# Patient Record
Sex: Female | Born: 1995 | Race: White | Hispanic: No | Marital: Single | Smoking: Former smoker
Health system: Southern US, Community
[De-identification: ages and names within clinical notes are randomized; demographics above are authoritative.]

## PROBLEM LIST (undated history)

## (undated) ENCOUNTER — Inpatient Hospital Stay: Payer: Self-pay

## (undated) DIAGNOSIS — R569 Unspecified convulsions: Secondary | ICD-10-CM

## (undated) DIAGNOSIS — J45909 Unspecified asthma, uncomplicated: Secondary | ICD-10-CM

## (undated) DIAGNOSIS — G4700 Insomnia, unspecified: Secondary | ICD-10-CM

## (undated) DIAGNOSIS — N289 Disorder of kidney and ureter, unspecified: Secondary | ICD-10-CM

## (undated) DIAGNOSIS — G43909 Migraine, unspecified, not intractable, without status migrainosus: Secondary | ICD-10-CM

## (undated) DIAGNOSIS — F419 Anxiety disorder, unspecified: Secondary | ICD-10-CM

## (undated) DIAGNOSIS — A6920 Lyme disease, unspecified: Secondary | ICD-10-CM

## (undated) DIAGNOSIS — F32A Depression, unspecified: Secondary | ICD-10-CM

## (undated) DIAGNOSIS — K589 Irritable bowel syndrome without diarrhea: Secondary | ICD-10-CM

## (undated) HISTORY — DX: Lyme disease, unspecified: A69.20

## (undated) HISTORY — DX: Anxiety disorder, unspecified: F41.9

## (undated) HISTORY — DX: Insomnia, unspecified: G47.00

## (undated) HISTORY — DX: Irritable bowel syndrome, unspecified: K58.9

## (undated) HISTORY — DX: Depression, unspecified: F32.A

## (undated) HISTORY — PX: NISSEN FUNDOPLICATION: SHX2091

---
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
## 2016-06-29 ENCOUNTER — Emergency Department
Admission: EM | Admit: 2016-06-29 | Discharge: 2016-06-30 | Disposition: A | Discharge status: Home / Self Care | Payer: Managed Care, Other (non HMO) | Attending: Emergency Medicine | Admitting: Emergency Medicine

## 2016-06-29 ENCOUNTER — Encounter: Payer: Self-pay | Admitting: Emergency Medicine

## 2016-06-29 DIAGNOSIS — R1031 Right lower quadrant pain: Secondary | ICD-10-CM | POA: Diagnosis not present

## 2016-06-29 DIAGNOSIS — Y999 Unspecified external cause status: Secondary | ICD-10-CM | POA: Diagnosis not present

## 2016-06-29 DIAGNOSIS — R109 Unspecified abdominal pain: Secondary | ICD-10-CM | POA: Diagnosis present

## 2016-06-29 DIAGNOSIS — R1012 Left upper quadrant pain: Secondary | ICD-10-CM | POA: Insufficient documentation

## 2016-06-29 DIAGNOSIS — Z79899 Other long term (current) drug therapy: Secondary | ICD-10-CM | POA: Diagnosis not present

## 2016-06-29 DIAGNOSIS — Y939 Activity, unspecified: Secondary | ICD-10-CM | POA: Diagnosis not present

## 2016-06-29 DIAGNOSIS — J45909 Unspecified asthma, uncomplicated: Secondary | ICD-10-CM | POA: Insufficient documentation

## 2016-06-29 DIAGNOSIS — M7918 Myalgia, other site: Secondary | ICD-10-CM

## 2016-06-29 DIAGNOSIS — R51 Headache: Secondary | ICD-10-CM | POA: Diagnosis not present

## 2016-06-29 DIAGNOSIS — R102 Pelvic and perineal pain: Secondary | ICD-10-CM | POA: Diagnosis not present

## 2016-06-29 DIAGNOSIS — Y92410 Unspecified street and highway as the place of occurrence of the external cause: Secondary | ICD-10-CM | POA: Insufficient documentation

## 2016-06-29 DIAGNOSIS — R1011 Right upper quadrant pain: Secondary | ICD-10-CM | POA: Diagnosis not present

## 2016-06-29 DIAGNOSIS — M549 Dorsalgia, unspecified: Secondary | ICD-10-CM | POA: Diagnosis not present

## 2016-06-29 HISTORY — DX: Unspecified asthma, uncomplicated: J45.909
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
## 2016-06-30 ENCOUNTER — Emergency Department: Payer: Managed Care, Other (non HMO)

## 2016-06-30 DIAGNOSIS — R1031 Right lower quadrant pain: Secondary | ICD-10-CM | POA: Diagnosis not present

## 2016-06-30 LAB — HCG, QUANTITATIVE, PREGNANCY: hCG, Beta Chain, Quant, S: 1 m[IU]/mL (ref <5)

## 2016-06-30 LAB — POCT PREGNANCY, URINE: Preg Test, Ur: NEGATIVE

## 2016-06-30 MED ORDER — LORAZEPAM 2 MG/ML IJ SOLN
INTRAMUSCULAR | Status: AC
  Filled 2016-06-30: qty 1

## 2016-06-30 MED ORDER — IOPAMIDOL (ISOVUE-300) INJECTION 61%
100.0000 mL | Freq: Once | INTRAVENOUS | Status: AC | PRN
  Administered 2016-06-30: 100 mL via INTRAVENOUS

## 2016-06-30 MED ORDER — CYCLOBENZAPRINE HCL 10 MG PO TABS: 10.0000 mg | ORAL_TABLET | Freq: Three times a day (TID) | ORAL | 0 refills | Status: DC | PRN

## 2016-06-30 MED ORDER — LORAZEPAM 2 MG/ML IJ SOLN: 1.0000 mg | Freq: Once | INTRAMUSCULAR | Status: DC

## 2016-06-30 MED ORDER — ONDANSETRON HCL 4 MG/2ML IJ SOLN
4.0000 mg | Freq: Once | INTRAMUSCULAR | Status: AC
  Administered 2016-06-30: 4 mg via INTRAVENOUS
  Filled 2016-06-30: qty 2

## 2016-06-30 MED ORDER — MORPHINE SULFATE (PF) 2 MG/ML IV SOLN
2.0000 mg | Freq: Once | INTRAVENOUS | Status: AC
  Administered 2016-06-30: 2 mg via INTRAVENOUS
  Filled 2016-06-30: qty 1

## 2016-06-30 NOTE — ED Notes (Signed)
Discharge instructions reviewed with patient. Patient verbalized understanding. Patient taken to lobby via wheelchair and helped into vehicle without difficulty.  

## 2016-06-30 NOTE — ED Notes (Signed)
Pt attempted to pee for POCT preg test but was unable to. Pt provided with water.
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
## 2018-11-27 ENCOUNTER — Encounter: Payer: Self-pay | Admitting: Physician Assistant

## 2018-11-27 ENCOUNTER — Telehealth: Payer: Managed Care, Other (non HMO) | Admitting: Physician Assistant

## 2018-11-27 DIAGNOSIS — R059 Cough, unspecified: Secondary | ICD-10-CM

## 2018-11-27 DIAGNOSIS — R0602 Shortness of breath: Secondary | ICD-10-CM

## 2018-11-27 DIAGNOSIS — J111 Influenza due to unidentified influenza virus with other respiratory manifestations: Secondary | ICD-10-CM

## 2018-11-27 DIAGNOSIS — R05 Cough: Secondary | ICD-10-CM

## 2018-11-27 MED ORDER — BENZONATATE 100 MG PO CAPS: 100.0000 mg | ORAL_CAPSULE | Freq: Three times a day (TID) | ORAL | 0 refills | Status: DC | PRN

## 2018-11-27 NOTE — Progress Notes (Signed)
E-Visit for Corona Virus Screening  Based on your current symptoms, it seems unlikely that your symptoms are related to the Coronavirus.     As per our converstaion, you have stated that yesterday you have tested positive for Flu A. Continue with the albuerol inhlaer, taken Tylenol as needed for the fever, and any worsening go to the ER. I have sent in medication for your cough. If the shortness of breath continues or worsens, go to the ER  Coronavirus disease 2019 (COVID-19) is a respiratory illness that can spread from person to person. The virus that causes COVID-19 is a new virus that was first identified in the country of [place] but is now found in multiple other countries and has spread to the [place].  Symptoms associated with the virus are mild to severe fever, cough, and shortness of breath. There is currently no vaccine to protect against COVID-19, and there is no specific antiviral treatment for the virus.   To be considered HIGH RISK for Coronavirus (COVID-19), you have to meet the following criteria:  . Traveled to [place], [place], [place], [place] or [place]; or in the [place] to [place], [place], [place], or [place]; and have fever, cough, and shortness of breath within the last 2 weeks of travel OR  . Been in close contact with a person diagnosed with COVID-19 within the last 2 weeks and have fever, cough, and shortness of breath  . IF YOU DO NOT MEET THESE CRITERIA, YOU ARE CONSIDERED LOW RISK FOR COVID-19.   It is vitally important that if you feel that you have an infection such as this virus or any other virus that you stay home and away from places where you may spread it to others.  You should self-quarantine for 14 days if you have symptoms that could potentially be coronavirus and avoid contact with people age 28 and older.   You can use medication such as A prescription cough medication called Tessalon Perles 100 mg. You may take 1-2 capsules every 8 hours  as needed for cough  You may also take acetaminophen (Tylenol) as needed for fever.   Reduce your risk of any infection by using the same precautions used for avoiding the common cold or flu:  Marland Kitchen Wash your hands often with soap and warm water for at least 20 seconds.  If soap and water are not readily available, use an alcohol-based hand sanitizer with at least 60% alcohol.  . If coughing or sneezing, cover your mouth and nose by coughing or sneezing into the elbow areas of your shirt or coat, into a tissue or into your sleeve (not your hands). . Avoid shaking hands with others and consider head nods or verbal greetings only. . Avoid touching your eyes, nose, or mouth with unwashed hands.  . Avoid close contact with people who are sick. . Avoid places or events with large numbers of people in one location, like concerts or sporting events. . Carefully consider travel plans you have or are making. . If you are planning any travel outside or inside the [place], visit the CDC's Travelers' Health webpage for the latest health notices. . If you have some symptoms but not all symptoms, continue to monitor at home and seek medical attention if your symptoms worsen. . If you are having a medical emergency, call 911.  HOME CARE . Only take medications as instructed by your medical team. . Drink plenty of fluids and get plenty of rest. .  A steam or ultrasonic humidifier can help if you have congestion.   GET HELP RIGHT AWAY IF: . You develop worsening fever. . You become short of breath . You cough up blood. . Your symptoms become more severe MAKE SURE YOU   Understand these instructions.  Will watch your condition.  Will get help right away if you are not doing well or get worse.  Your e-visit answers were reviewed by a board certified advanced clinical practitioner to complete your personal care plan.  Depending on the condition, your plan could have included both over the counter or  prescription medications.  If there is a problem please reply once you have received a response from your provider. Your safety is important to [place].  If you have drug allergies check your prescription carefully.    You can use MyChart to ask questions about today's visit, request a non-urgent call back, or ask for a work or school excuse for 24 hours related to this e-Visit. If it has been greater than 24 hours you will need to follow up with your provider, or enter a new e-Visit to address those concerns. You will get an e-mail in the next two days asking about your experience.  I hope that your e-visit has been valuable and will speed your recovery. Thank you for using e-visits.   I have spent 7 min in completion and review of this note- [facility]

## 2019-03-26 DIAGNOSIS — J45909 Unspecified asthma, uncomplicated: Secondary | ICD-10-CM | POA: Insufficient documentation

## 2020-01-02 ENCOUNTER — Ambulatory Visit (INDEPENDENT_AMBULATORY_CARE_PROVIDER_SITE_OTHER): Payer: BC Managed Care – PPO | Admitting: Family Medicine

## 2020-01-02 ENCOUNTER — Other Ambulatory Visit: Payer: Self-pay

## 2020-01-02 ENCOUNTER — Encounter: Payer: Self-pay | Admitting: Family Medicine

## 2020-01-02 VITALS — BP 132/60 | HR 106 | Ht 60.25 in | Wt 100.2 lb

## 2020-01-02 DIAGNOSIS — R21 Rash and other nonspecific skin eruption: Secondary | ICD-10-CM

## 2020-01-02 DIAGNOSIS — F32A Depression, unspecified: Secondary | ICD-10-CM

## 2020-01-02 DIAGNOSIS — R41840 Attention and concentration deficit: Secondary | ICD-10-CM

## 2020-01-02 DIAGNOSIS — F329 Major depressive disorder, single episode, unspecified: Secondary | ICD-10-CM | POA: Diagnosis not present

## 2020-01-02 DIAGNOSIS — Z1321 Encounter for screening for nutritional disorder: Secondary | ICD-10-CM

## 2020-01-02 DIAGNOSIS — W57XXXA Bitten or stung by nonvenomous insect and other nonvenomous arthropods, initial encounter: Secondary | ICD-10-CM

## 2020-01-02 DIAGNOSIS — A6920 Lyme disease, unspecified: Secondary | ICD-10-CM

## 2020-01-02 DIAGNOSIS — Z130 Encounter for screening for diseases of the blood and blood-forming organs and certain disorders involving the immune mechanism: Secondary | ICD-10-CM | POA: Diagnosis not present

## 2020-01-02 DIAGNOSIS — Z13220 Encounter for screening for lipoid disorders: Secondary | ICD-10-CM | POA: Diagnosis not present

## 2020-01-02 DIAGNOSIS — S30861A Insect bite (nonvenomous) of abdominal wall, initial encounter: Secondary | ICD-10-CM

## 2020-01-02 MED ORDER — BUPROPION HCL ER (XL) 150 MG PO TB24: 150.0000 mg | ORAL_TABLET | Freq: Every day | ORAL | 4 refills | Status: DC

## 2020-01-02 MED ORDER — DOXYCYCLINE HYCLATE 100 MG PO CAPS: 100.0000 mg | ORAL_CAPSULE | Freq: Two times a day (BID) | ORAL | 0 refills | Status: DC

## 2020-01-02 NOTE — Progress Notes (Signed)
Subjective:    Patient ID: Jenny Cain, female    DOB: 1995/10/25, 24 y.o.   MRN: [id number]  HPI Chief Complaint  Patient presents with  . Tick Removal   This is a 24 yo female who presents today for above cc. She is not an established patient but will be returning at a later date to establish care.  She lives with her father who is also a patient here.  She works at [employer]. Has a dog, two [place] pigs, 2 hamsters.  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  Went partway through her program, she will be able to work at [employer] office where she is currently doing her clinical hours.  Last CPE-regular GYN care Pap-unsure Tdap-2008 per [facility], declines getting this today Not sexually active.   Went hiking 1.5 weeks ago. A few hours after hiking, found a tick on her body, pulled it off and flushed it. She went to [facility] a couple of days ago due to increasing rash, No intervention. Was told to put Neosporin on it. Rash has continued to spread. No new headaches, no body aches, no fever.   ADHD- was on adderall previously, has having difficulty with focus, in Vet program. Adderall made her lose her appetite and she did not like the way she felt on it. Has also been having some depression recently.   Review of Systems Per HPI    Objective:   Physical Exam Vitals reviewed.  Constitutional:      General: She is not in acute distress.    Appearance: Normal appearance. She is normal weight. She is not ill-appearing, toxic-appearing or diaphoretic.  HENT:     Head: Normocephalic and atraumatic.  Eyes:     Conjunctiva/sclera: Conjunctivae normal.  Cardiovascular:     Rate and Rhythm: Tachycardia present.  Pulmonary:     Effort: Pulmonary effort is normal.  Skin:    General: Skin is warm and dry.     Findings: Rash (diffuse area erythema on abdomen below umbilicuss. Pinpoint papule. No warmth, drainage. ) present.  Neurological:     Mental  Status: She is alert and oriented to person, place, and time.  Psychiatric:        Mood and Affect: Mood normal.        Behavior: Behavior normal.        Thought Content: Thought content normal.        Judgment: Judgment normal.       BP 132/60 (BP Location: Left Arm, Patient Position: Sitting, Cuff Size: Normal)   Pulse (!) 106   Ht 5' 0.25" (1.53 m)   Wt 100 lb 3.2 oz (45.5 kg)   SpO2 98%   BMI 19.41 kg/m  Depression screen [facility] 2/9 01/02/2020  Decreased Interest 0  Down, Depressed, Hopeless 1  PHQ - 2 Score 1  Altered sleeping 3  Tired, decreased energy 3  Change in appetite 0  Feeling bad or failure about yourself  3  Trouble concentrating 3  Moving slowly or fidgety/restless 0  Suicidal thoughts 0  PHQ-9 Score 13  Difficult doing work/chores Very difficult       Assessment & Plan:  1. Tick bite of abdomen, initial encounter - with rash, will go ahead and check labs and give doxycycline. Discussed need for avoiding sun exposure, she will take after her beach trip, warned of possible problems if she became pregnant, to avoid pregnancy  while taking - doxycycline (VIBRAMYCIN) 100 MG capsule; Take 1 capsule (100 mg total) by mouth 2 (two) times daily.  Dispense: 14 capsule; Refill: 0 - Rocky mtn spotted fvr abs pnl(IgG+IgM) - B. burgdorfi antibodies by WB  2. Poor concentration - buPROPion (WELLBUTRIN XL) 150 MG 24 hr tablet; Take 1 tablet (150 mg total) by mouth daily.  Dispense: 30 tablet; Refill: 4  3. Rash - doxycycline (VIBRAMYCIN) 100 MG capsule; Take 1 capsule (100 mg total) by mouth 2 (two) times daily.  Dispense: 14 capsule; Refill: 0 - Rocky mtn spotted fvr abs pnl(IgG+IgM) - B. burgdorfi antibodies by WB  4. Screening for deficiency anemia - CBC with Differential  5. Screening for lipid disorders - Lipid Panel  6. Encounter for vitamin deficiency screening - Vitamin D, 25-hydroxy  7. Depression, unspecified depression type - CBC with Differential -  Vitamin D, 25-hydroxy - Bupropion xl 150 mg po qd - will discuss further at establish care visit  This visit occurred during the SARS-CoV-2 public health emergency.  Safety protocols were in place, including screening questions prior to the visit, additional usage of staff PPE, and extensive cleaning of exam room while observing appropriate contact time as indicated for disinfecting solutions.     Olean Ree, FNP-BC  [facility] at [facility], [facility]  01/02/2020 4:26 PM

## 2020-01-02 NOTE — Patient Instructions (Signed)
Good to meet you today  Start doxycycline after beach trip  I will notify you of lab results  Start bupropion 150 mg for concentration- start after you finish doxycycline    Living With Attention Deficit Hyperactivity Disorder If you have been diagnosed with attention deficit hyperactivity disorder (ADHD), you may be relieved that you now know why you have felt or behaved a certain way. Still, you may feel overwhelmed about the treatment ahead. You may also wonder how to get the support you need and how to deal with the condition day-to-day. With treatment and support, you can live with ADHD and manage your symptoms. How to manage lifestyle changes Managing stress Stress is your body's reaction to life changes and events, both good and bad. To cope with the stress of an ADHD diagnosis, it may help to:  Learn more about ADHD.  Exercise regularly. Even a short daily walk can lower stress levels.  Participate in training or education programs (including social skills training classes) that teach you to deal with symptoms.  Medicines Your health care provider may suggest certain medicines if he or she feels that they will help to improve your condition. Stimulant medicines are usually prescribed to treat ADHD, and therapy may also be prescribed. It is important to:  Avoid using alcohol and other substances that may prevent your medicines from working properly [facility]).  Talk with your pharmacist or health care provider about all the medicines that you take, their possible side effects, and what medicines are safe to take together.  Make it your goal to take part in all treatment decisions (shared decision-making). Ask about possible side effects of medicines that your health care provider recommends, and tell him or her how you feel about having those side effects. It is best if shared decision-making with your health care provider is part of your total treatment  plan. Relationships To strengthen your relationships with family members while treating your condition, consider taking part in family therapy. You might also attend self-help groups alone or with a loved one. Be honest about how your symptoms affect your relationships. Make an effort to communicate respectfully instead of fighting, and find ways to show others that you care. Psychotherapy may be useful in helping you cope with how ADHD affects your relationships. How to recognize changes in your condition The following signs may mean that your treatment is working well and your condition is improving:  Consistently being on time for appointments.  Being more organized at home and work.  Other people noticing improvements in your behavior.  Achieving goals that you set for yourself.  Thinking more clearly. The following signs may mean that your treatment is not working very well:  Feeling impatience or more confusion.  Missing, forgetting, or being late for appointments.  An increasing sense of disorganization and messiness.  More difficulty in reaching goals that you set for yourself.  Loved ones becoming angry or frustrated with you. Where to find support Talking to others  Keep emotion out of important discussions and speak in a calm, logical way.  Listen closely and patiently to your loved ones. Try to understand their point of view, and try to avoid getting defensive.  Take responsibility for the consequences of your actions.  Ask that others do not take your behaviors personally.  Aim to solve problems as they come up, and express your feelings instead of bottling them up.  Talk openly about what you need from your loved ones and how  they can support you.  Consider going to family therapy sessions or having your family meet with a specialist who deals with ADHD-related behavior problems. Finances Not all insurance plans cover mental health care, so it is important to  check with your insurance carrier. If paying for co-pays or counseling services is a problem, search for a local or county mental health care center. Public mental health care services may be offered there at a low cost or no cost when you are not able to see a private health care provider. If you are taking medicine for ADHD, you may be able to get the generic form, which may be less expensive than brand-name medicine. Some makers of prescription medicines also offer help to patients who cannot afford the medicines that they need. Follow these instructions at home:  Take over-the-counter and prescription medicines only as told by your health care provider. Check with your health care provider before taking any new medicines.  Create structure and an organized atmosphere at home. For example: ? Make a list of tasks, then rank them from most important to least important. Work on one task at a time until your listed tasks are done. ? Make a daily schedule and follow it consistently every day. ? Use an appointment calendar, and check it 2 or 3 times a day to keep on track. Keep it with you when you leave the house. ? Create spaces where you keep certain things, and always put things back in their places after you use them.  Keep all follow-up visits as told by your health care provider. This is important. Questions to ask your health care provider:  What are the risks and benefits of taking medicines?  Would I benefit from therapy?  How often should I follow up with a health care provider? Contact a health care provider if:  You have side effects from your medicines, such as: ? Repeated muscle twitches, coughing, or speech outbursts. ? Sleep problems. ? Loss of appetite. ? Depression. ? New or worsening behavior problems. ? Dizziness. ? Unusually fast heartbeat. ? Stomach pains. ? Headaches. Get help right away if:  You have a severe reaction to a medicine.  Your behavior suddenly  gets worse. Summary  With treatment and support, you can live with ADHD and manage your symptoms.  The medicines that are most often prescribed for ADHD are stimulants.  Consider taking part in family therapy or self-help groups with family members or friends.  When you talk with friends and family about your ADHD, be patient and communicate openly.  Take over-the-counter and prescription medicines only as told by your health care provider. Check with your health care provider before taking any new medicines. This information is not intended to replace advice given to you by your health care provider. Make sure you discuss any questions you have with your health care provider. Document Revised: 12/15/2018 Document Reviewed: 12/23/2016 Elsevier Patient Education  2020 [employer].

## 2020-01-03 LAB — LIPID PANEL
Cholesterol: 157 mg/dL (ref 0–200)
HDL: 61.4 mg/dL (ref >39.00)
LDL Cholesterol: 84 mg/dL (ref 0–99)
NonHDL: 95.1
Total CHOL/HDL Ratio: 3
Triglycerides: 55 mg/dL (ref 0.0–149.0)
VLDL: 11 mg/dL (ref 0.0–40.0)

## 2020-01-03 LAB — CBC WITH DIFFERENTIAL/PLATELET
Basophils Absolute: 0.1 10*3/uL (ref 0.0–0.1)
Basophils Relative: 0.9 % (ref 0.0–3.0)
Eosinophils Absolute: 0.2 10*3/uL (ref 0.0–0.7)
Eosinophils Relative: 2.2 % (ref 0.0–5.0)
HCT: 39.5 % (ref 36.0–46.0)
Hemoglobin: 13.6 g/dL (ref 12.0–15.0)
Lymphocytes Relative: 24.2 % (ref 12.0–46.0)
Lymphs Abs: 1.9 10*3/uL (ref 0.7–4.0)
MCHC: 34.4 g/dL (ref 30.0–36.0)
MCV: 96.1 fl (ref 78.0–100.0)
Monocytes Absolute: 0.6 10*3/uL (ref 0.1–1.0)
Monocytes Relative: 6.9 % (ref 3.0–12.0)
Neutro Abs: 5.3 10*3/uL (ref 1.4–7.7)
Neutrophils Relative %: 65.8 % (ref 43.0–77.0)
Platelets: 188 10*3/uL (ref 150.0–400.0)
RBC: 4.11 Mil/uL (ref 3.87–5.11)
RDW: 12.4 % (ref 11.5–15.5)
WBC: 8 10*3/uL (ref 4.0–10.5)

## 2020-01-03 LAB — VITAMIN D 25 HYDROXY (VIT D DEFICIENCY, FRACTURES): VITD: 27.33 ng/mL — ABNORMAL LOW (ref 30.00–100.00)

## 2020-01-04 LAB — B. BURGDORFI ANTIBODIES BY WB
B burgdorferi IgG Abs (IB): NEGATIVE
B burgdorferi IgM Abs (IB): POSITIVE — AB
Lyme Disease 18 kD IgG: NONREACTIVE
Lyme Disease 23 kD IgG: NONREACTIVE
Lyme Disease 23 kD IgM: REACTIVE — AB
Lyme Disease 28 kD IgG: NONREACTIVE
Lyme Disease 30 kD IgG: NONREACTIVE
Lyme Disease 39 kD IgG: NONREACTIVE
Lyme Disease 39 kD IgM: REACTIVE — AB
Lyme Disease 41 kD IgG: REACTIVE — AB
Lyme Disease 41 kD IgM: NONREACTIVE
Lyme Disease 45 kD IgG: NONREACTIVE
Lyme Disease 58 kD IgG: NONREACTIVE
Lyme Disease 66 kD IgG: NONREACTIVE
Lyme Disease 93 kD IgG: NONREACTIVE

## 2020-01-04 LAB — ROCKY MTN SPOTTED FVR ABS PNL(IGG+IGM)
RMSF IgG: NOT DETECTED
RMSF IgM: NOT DETECTED

## 2020-01-04 MED ORDER — DOXYCYCLINE HYCLATE 100 MG PO CAPS: 100.0000 mg | ORAL_CAPSULE | Freq: Two times a day (BID) | ORAL | 0 refills | Status: AC

## 2020-01-04 NOTE — Addendum Note (Signed)
Addended by: Olean Ree B on: 01/04/2020 04:58 PM   Modules accepted: Orders

## 2020-01-08 ENCOUNTER — Telehealth: Payer: Self-pay

## 2020-01-08 NOTE — Telephone Encounter (Signed)
Please call patient and let her know that I do not think the antibiotic should affect her other medications. Be sure to take it with food and a full glass of water.

## 2020-01-08 NOTE — Telephone Encounter (Signed)
Pt left v/m that she had visit with Harlin Heys FNP on 01/02/20 and pt was dx with lyme disease. Pt was taking anxiety med prior to dx of lyme disease and since dx with lyme disease took anxiety med and pt had panic attack and felt exhausted; pt wanted to know if the lyme disease was affecting how the anxiety med was reacting to pt and should pt not take anxiety med until lyme disease was cleared. Pt request cb.

## 2020-01-09 NOTE — Telephone Encounter (Signed)
I attempted to contact patient, but VM box was full.

## 2020-01-11 ENCOUNTER — Encounter: Payer: Self-pay | Admitting: Family Medicine

## 2020-01-11 NOTE — Telephone Encounter (Signed)
Patient returned call  Advised of message from Deboraha Sprang She stated she does not think the Antibiotic is causing any issues She is concerned that it is her Anxiety medication.  Patient stated she ended up getting really sick after taking and also had an anxiety attack. She would like to know if she needs to cut the medication in half or what you suggest   Please advise

## 2020-01-11 NOTE — Telephone Encounter (Signed)
Called patient no answer, LMTCB and also sent message via MyChart regarding questions and medication. Asked patient to give [place] a call back.

## 2020-01-11 NOTE — Telephone Encounter (Signed)
Called patient. No answer. No DPR on file to leave message. Will try again later.

## 2020-01-11 NOTE — Telephone Encounter (Signed)
Attempted to call patient again.  No answer.  Will send my chart message to try and clarify her symptoms.

## 2020-01-18 ENCOUNTER — Telehealth: Payer: Self-pay

## 2020-01-18 ENCOUNTER — Encounter: Payer: Self-pay | Admitting: Family Medicine

## 2020-01-18 NOTE — Telephone Encounter (Signed)
Patient contacted the office requesting an appt. Patient states she has been taking the doxycycline as prescribe for lyme's disease, but she states she is not feeling any better - and is actually feeling worse.  She states she has a severe headache, left ear pain, body aches, and nasal congestion. Patient states she even feels like she has heart palpitations at times.  Patient denies any fever, or shortness of breath. I advised that with her symptoms, we could not bring her into the office, and she would need to be seen in an UC, as they could assess her symptoms and treat her appropriately. Patient states she took off of work today, and does not have the money to go to an UC. She would like any other recommendations from Debbie as to what she can do.

## 2020-01-18 NOTE — Telephone Encounter (Signed)
Pt has called again twice and is requesting a call back as soon as possible.  Jenny Cain is letting her know that we are sending this to Eunice Blase again but that we have been in clinic all day and we will call her back before the end of today.

## 2020-01-18 NOTE — Telephone Encounter (Signed)
[facility] Night - Client Nonclinical Telephone Record AccessNurse Client [facility] Night - Client Client Site [facility] - Night Physician [facility] - PHYSICIAN, Crissie Figures- MD Contact Type Call Who Is Calling Patient / Member / Family / Caregiver Caller Name Ghislaine Harcum Caller Phone Number [phone] Patient Name Jenny Cain Patient DOB May 23, 1996 Call Type Message Only Information Provided Reason for Call Request to Schedule Office Appointment Initial Comment Caller states she needs to make an appt. Additional Comment Disp. Time Disposition Final User 01/18/2020 8:06:48 AM General Information Provided Yes Green, Amy Call Closed By: Rudi Rummage Transaction Date/Time: 01/18/2020 8:04:39 AM (ET)

## 2020-01-18 NOTE — Telephone Encounter (Signed)
Called and spoke with patient.  She reports that she has "been working too much," at [employer] where she works.  She has been having to do a lot of heavy lifting and has been tired and achy.  She reports that she started doxycycline May 2 for her Lyme diagnosis.  She tells me that she has 17 pills remaining but is not sure that she counted correctly.  She should be almost finished with her 2-week course by now.  She is having runny nose left ear pain, headache, pressure behind her eyes and ears.  She has been having some chills and sweats but no fever.  She reports that she has good water intake.  She has been taking Benadryl at bedtime, Zicam.  No improvement with Tylenol.  I told her that I did not think her symptoms were related to her Lyme diagnosis.  I suspect she is having some allergic rhinitis symptoms and encouraged her to take daily antihistamine, use over-the-counter Afrin type nasal spray or if she thinks she can tolerate it, a little pseudoephedrine.  She is not sure that she can tolerate due to history of palpitations and anxiety.  I agreed to give her a note for today and tomorrow out of work and then 1 week of light duty.  Encouraged her follow-up at the beginning of next week if she is still having bothersome symptoms.

## 2020-02-11 ENCOUNTER — Telehealth: Payer: Self-pay | Admitting: Family Medicine

## 2020-02-11 ENCOUNTER — Encounter: Payer: Self-pay | Admitting: Family Medicine

## 2020-02-11 ENCOUNTER — Ambulatory Visit (INDEPENDENT_AMBULATORY_CARE_PROVIDER_SITE_OTHER): Payer: BC Managed Care – PPO | Admitting: Family Medicine

## 2020-02-11 ENCOUNTER — Other Ambulatory Visit: Payer: Self-pay

## 2020-02-11 VITALS — BP 122/86 | HR 111 | Temp 97.9°F | Ht 60.0 in | Wt 102.0 lb

## 2020-02-11 DIAGNOSIS — G2581 Restless legs syndrome: Secondary | ICD-10-CM | POA: Diagnosis not present

## 2020-02-11 DIAGNOSIS — F909 Attention-deficit hyperactivity disorder, unspecified type: Secondary | ICD-10-CM | POA: Diagnosis not present

## 2020-02-11 DIAGNOSIS — F329 Major depressive disorder, single episode, unspecified: Secondary | ICD-10-CM | POA: Diagnosis not present

## 2020-02-11 DIAGNOSIS — F419 Anxiety disorder, unspecified: Secondary | ICD-10-CM | POA: Diagnosis not present

## 2020-02-11 DIAGNOSIS — F32A Depression, unspecified: Secondary | ICD-10-CM

## 2020-02-11 MED ORDER — GABAPENTIN 100 MG PO CAPS
100.0000 mg | ORAL_CAPSULE | Freq: Every day | ORAL | 1 refills | Status: DC
Start: 2020-02-11 — End: 2021-01-29

## 2020-02-11 NOTE — Telephone Encounter (Signed)
Pt seen today and did not make it to the lab to have labs drawn.   LM for patient to call back to schedule a LABS ONLY appt.

## 2020-02-11 NOTE — Patient Instructions (Addendum)
Can try over the counter magnesium for restless legs  I have sent in gabapentin for your restless legs. Take one tablet 1-2 hours before bedtime. Can increase to 2 tablets after one week if tolerating and can increase to 3 tablets if needed.   Follow up in 3 months    Restless Legs Syndrome Restless legs syndrome is a condition that causes uncomfortable feelings or sensations in the legs, especially while sitting or lying down. The sensations usually cause an overwhelming urge to move the legs. The arms can also sometimes be affected. The condition can range from mild to severe. The symptoms often interfere with a person's ability to sleep. What are the causes? The cause of this condition is not known. What increases the risk? The following factors may make you more likely to develop this condition:  Being older than 50.  Pregnancy.  Being a woman. In general, the condition is more common in women than in men.  A family history of the condition.  Having iron deficiency.  Overuse of caffeine, nicotine, or alcohol.  Certain medical conditions, such as kidney disease, Parkinson's disease, or nerve damage.  Certain medicines, such as those for high blood pressure, nausea, colds, allergies, depression, and some heart conditions. What are the signs or symptoms? The main symptom of this condition is uncomfortable sensations in the legs, such as:  Pulling.  Tingling.  Prickling.  Throbbing.  Crawling.  Burning. Usually, the sensations:  Affect both sides of the body.  Are worse when you sit or lie down.  Are worse at night. These may wake you up or make it difficult to fall asleep.  Make you have a strong urge to move your legs.  Are temporarily relieved by moving your legs. The arms can also be affected, but this is rare. People who have this condition often have tiredness during the day because of their lack of sleep at night. How is this diagnosed? This condition  may be diagnosed based on:  Your symptoms.  Blood tests. In some cases, you may be monitored in a sleep lab by a specialist (a sleep study). This can detect any disruptions in your sleep. How is this treated? This condition is treated by managing the symptoms. This may include:  Lifestyle changes, such as exercising, using relaxation techniques, and avoiding caffeine, alcohol, or tobacco.  Medicines. Anti-seizure medicines may be tried first. Follow these instructions at home:     General instructions  Take over-the-counter and prescription medicines only as told by your health care provider.  Use methods to help relieve the uncomfortable sensations, such as: ? Massaging your legs. ? Walking or stretching. ? Taking a cold or hot bath.  Keep all follow-up visits as told by your health care provider. This is important. Lifestyle  Practice good sleep habits. For example, go to bed and get up at the same time every day. Most adults should get 7-9 hours of sleep each night.  Exercise regularly. Try to get at least 30 minutes of exercise most days of the week.  Practice ways of relaxing, such as yoga or meditation.  Avoid caffeine and alcohol.  Do not use any products that contain nicotine or tobacco, such as cigarettes and e-cigarettes. If you need help quitting, ask your health care provider. Contact a health care provider if:  Your symptoms get worse or they do not improve with treatment. Summary  Restless legs syndrome is a condition that causes uncomfortable feelings or sensations in the legs,  especially while sitting or lying down.  The symptoms often interfere with a person's ability to sleep.  This condition is treated by managing the symptoms. You may need to make lifestyle changes or take medicines. This information is not intended to replace advice given to you by your health care provider. Make sure you discuss any questions you have with your health care  provider. Document Revised: 09/12/2017 Document Reviewed: 09/12/2017 Elsevier Patient Education  2020 [employer].

## 2020-02-11 NOTE — Progress Notes (Signed)
Subjective:    Patient ID: Jenny Cain, female    DOB: 04-05-1996, 24 y.o.   MRN: [id number]  HPI Chief Complaint  Patient presents with  . New Patient (Initial Visit)    Estab care   This is a 24 yo female who presents today to establish care. REcieved records from pediatrician, Dr. Truddie Coco.   Last CPE- 03/26/2019 Pap-v? 03/26/19- had gyn visit Tdap- 01/25/2014  Was seen 01/02/2020 for acute visit for tick bite. Completed.  course of doxy.   Depression- was started on bupropion at last visit for depression/ problems concentrating. Did not tolerate. Took one dose and had palpitations, felt like she was going to pass out. Took adderall but didn't sleep or eat. Last testing in HS. Also with hx concussion. Longstanding history depression/ anxiety. Increased stress with new job, going to school.   Poor sleep. Trouble going and staying asleep. Sleeping 5 hours on good day. Takes 1-2 diphenhydramine.   Has had restless legs since 3rd grade. Overwhelming need to move and "pop," legs when trying to go to sleep.    Review of Systems Per HPI    Objective:   Physical Exam Vitals reviewed.  Constitutional:      General: She is not in acute distress.    Appearance: Normal appearance. She is normal weight. She is not ill-appearing, toxic-appearing or diaphoretic.  HENT:     Head: Normocephalic and atraumatic.  Eyes:     Conjunctiva/sclera: Conjunctivae normal.  Cardiovascular:     Rate and Rhythm: Tachycardia present.  Pulmonary:     Effort: Pulmonary effort is normal.  Neurological:     Mental Status: She is alert.  Psychiatric:        Mood and Affect: Mood is anxious (mildly).        Speech: Speech normal.        Behavior: Behavior normal.        Thought Content: Thought content normal.          BP 122/86 (BP Location: Left Arm, Patient Position: Sitting, Cuff Size: Normal)   Pulse (!) 111   Temp 97.9 F (36.6 C) (Temporal)   Ht 5' (1.524 m)   Wt 102 lb (46.3 kg)    LMP 02/09/2020 (Exact Date)   SpO2 96%   BMI 19.92 kg/m  Wt Readings from Last 3 Encounters:  02/11/20 102 lb (46.3 kg)  01/02/20 100 lb 3.2 oz (45.5 kg)  06/29/16 103 lb (46.7 kg)   Depression screen [facility] 2/9 02/11/2020 01/02/2020  Decreased Interest 0 0  Down, Depressed, Hopeless 0 1  PHQ - 2 Score 0 1  Altered sleeping 3 3  Tired, decreased energy 3 3  Change in appetite 0 0  Feeling bad or failure about yourself  0 3  Trouble concentrating 1 3  Moving slowly or fidgety/restless 0 0  Suicidal thoughts 0 0  PHQ-9 Score 7 13  Difficult doing work/chores Somewhat difficult Very difficult     Assessment & Plan:  1. Restless legs - worse with diphenhydramine, will try low dose gabapentin, discussed possible side effects, will check ferritin. Other labs checked recently.  - gabapentin (NEURONTIN) 100 MG capsule; Take 1-3 capsules (100-300 mg total) by mouth at bedtime.  Dispense: 30 capsule; Refill: 1 - Comprehensive metabolic panel; Future - Ferritin; Future - follow up in 3 months  2. Attention deficit hyperactivity disorder (ADHD), unspecified ADHD type - provided her resources for evaluation as associated anxiety/ depression and prior failed medication  make this more difficult  3. Anxiety and depression - per number 2, she is agreeable to seeing psychology for testing, recommendations  This visit occurred during the SARS-CoV-2 public health emergency.  Safety protocols were in place, including screening questions prior to the visit, additional usage of staff PPE, and extensive cleaning of exam room while observing appropriate contact time as indicated for disinfecting solutions.      Olean Ree, FNP-BC  [facility] at [facility], [facility]  02/11/2020 5:37 PM

## 2020-02-18 NOTE — Telephone Encounter (Signed)
Message left for patient to return my call.  

## 2020-02-20 ENCOUNTER — Encounter: Payer: Self-pay | Admitting: *Deleted

## 2020-02-20 NOTE — Telephone Encounter (Signed)
Left detailed message about scheduling a LABS ONLY appt.   Letter mailed to contact our office.   Nothing further needed. q

## 2020-03-05 NOTE — Telephone Encounter (Signed)
Pt was seen 02/11/20 in office.   Nothing further needed.

## 2020-03-07 ENCOUNTER — Ambulatory Visit: Payer: BC Managed Care – PPO | Admitting: Family Medicine

## 2020-03-07 DIAGNOSIS — Z0289 Encounter for other administrative examinations: Secondary | ICD-10-CM

## 2020-03-13 ENCOUNTER — Other Ambulatory Visit: Payer: Self-pay

## 2020-03-13 ENCOUNTER — Ambulatory Visit
Admission: EM | Admit: 2020-03-13 | Discharge: 2020-03-13 | Disposition: A | Discharge status: Home / Self Care | Payer: Self-pay | Attending: Internal Medicine | Admitting: Internal Medicine

## 2020-03-13 DIAGNOSIS — S0101XA Laceration without foreign body of scalp, initial encounter: Secondary | ICD-10-CM

## 2020-03-13 MED ORDER — SULFAMETHOXAZOLE-TRIMETHOPRIM 800-160 MG PO TABS: 1.0000 | ORAL_TABLET | Freq: Two times a day (BID) | ORAL | 0 refills | Status: DC

## 2020-03-13 NOTE — ED Provider Notes (Signed)
[facility]    CSN: [id number] Arrival date & time: 03/13/20  1752      History   Chief Complaint Chief Complaint  Patient presents with  . Work Related Injury  . Head Laceration    HPI Jenny Cain is a 24 y.o. female who presents today for evaluation of a forehead laceration.  The patient was at work at the [employer] clinic, she was leaning over to put up a leash and hit the corner of a metal cabinet which caused a laceration to her left forehead.  The laceration is a vertical laceration in nature and starts just above her eyebrow but does extend to her eyebrow.  She denies any vision loss or vision changes.  She does report a headache which is an aching throbbing discomfort but denies any numbness or tingling around her face or hands and denies any memory loss or loss of consciousness.  The patient did undergo a tetanus booster in 2015.  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  She denies any nausea or vomiting.  Denies any sensitivity to light.  She presents today for evaluation of her forehead laceration and possible laceration repair.  HPI  Past Medical History:  Diagnosis Date  . Anxiety   . Asthma   . Insomnia   . Migraine   . Premature baby   . Seizures [facility])     Patient Active Problem List   Diagnosis Date Noted  . Prematurity 02/11/2020  . Asthma 03/26/2019  . Headache(784.0) 02/12/2013  . Posttraumatic headache 02/12/2013  . Depression 02/12/2013  . Anxiety state, unspecified 02/12/2013  . Insomnia 02/12/2013  . Memory changes 02/12/2013    Past Surgical History:  Procedure Laterality Date  . NISSEN FUNDOPLICATION      OB History   No obstetric history on file.      Home Medications    Prior to Admission medications   Medication Sig Start Date End Date Taking? Authorizing Provider  albuterol (PROAIR HFA) 108 (90 Base) MCG/ACT inhaler ProAir HFA 90 mcg/actuation aerosol inhaler  INHALE 1 TO 2 PUFFS BY MOUTH EVERY 4 TO 6 HOURS AS  NEEDED   Yes [provider]  Ascorbic Acid (VITAMIN C) 1000 MG tablet Take 1,000 mg by mouth daily.   Yes [provider]  Cholecalciferol (VITAMIN D3) 25 MCG (1000 UT) CAPS Take 1 capsule by mouth daily.   Yes [provider]  gabapentin (NEURONTIN) 100 MG capsule Take 1-3 capsules (100-300 mg total) by mouth at bedtime. 02/11/20  Yes Emi Belfast, FNP  Melatonin 5 MG TABS Take by mouth.   Yes [provider]  sulfamethoxazole-trimethoprim (BACTRIM DS) 800-160 MG tablet Take 1 tablet by mouth 2 (two) times daily for 7 days. 03/13/20 03/20/20  Anson Oregon, PA-C    Family History Family History  Problem Relation Age of Onset  . Depression Mother   . Migraines Mother   . Depression Father   . Multiple sclerosis Father   . Heart attack Maternal Grandmother   . Heart attack Paternal Grandmother   . Breast cancer Maternal Aunt     Social History Social History   Tobacco Use  . Smoking status: Former Smoker    Years: 2.00    Types: Cigarettes  . Smokeless tobacco: Never Used  . Tobacco comment: smoked in high school  Vaping Use  . Vaping Use: Some days  . Devices: disposable  Substance Use Topics  . Alcohol use: Yes  Comment: once a month  . Drug use: No     Allergies   Amoxicillin, Cefprozil, and Penicillins   Review of Systems Review of Systems  Constitutional: Negative.   HENT: Negative.   Eyes: Negative.   Respiratory: Negative.   Cardiovascular: Negative.   Endocrine: Negative.   Genitourinary: Negative.   Musculoskeletal: Negative.   Skin: Positive for wound.  Allergic/Immunologic: Negative.   Neurological: Negative.   Hematological: Negative.   Psychiatric/Behavioral: Negative.   All other systems reviewed and are negative.  Physical Exam Triage Vital Signs ED Triage Vitals  Enc Vitals Group     BP 03/13/20 1814 (!) 143/99     Pulse Rate 03/13/20 1814 91     Resp 03/13/20 1814 18     Temp 03/13/20 1814  98.8 F (37.1 C)     Temp Source 03/13/20 1814 Oral     SpO2 03/13/20 1814 100 %     Weight 03/13/20 1812 102 lb (46.3 kg)     Height 03/13/20 1812 5' (1.524 m)     Head Circumference --      Peak Flow --      Pain Score 03/13/20 1811 9     Pain Loc --      Pain Edu? --      Excl. in GC? --    No data found.  Updated Vital Signs BP (!) 143/99 (BP Location: Left Arm)   Pulse 91   Temp 98.8 F (37.1 C) (Oral)   Resp 18   Ht 5' (1.524 m)   Wt 102 lb (46.3 kg)   LMP 03/09/2020   SpO2 100%   BMI 19.92 kg/m    Physical Exam Constitutional:      General: She is not in acute distress.    Appearance: She is not ill-appearing.  HENT:     Head:      Comments: 2 cm vertical laceration just superior to the left eyebrow Eyes:     General:        Left eye: No foreign body.     Extraocular Movements:     Left eye: Normal extraocular motion.     Conjunctiva/sclera:     Left eye: Left conjunctiva is not injected. No chemosis or exudate. Neurological:     Mental Status: She is alert.    Skin examination of the patient's forehead reveals a approximately 2 cm vertical laceration that extends just above the medial portion of her eyebrow and does extend down to the edge of her superior eyebrow.  The incision is not actively bleeding.  There is no foreign body which can be palpated or visualized.  The patient is able to raise her eyebrows and blink happily without significant discomfort.  She has full EOM without difficulty.  No vision changes to the left eye.  UC Treatments / Results  Labs (all labs ordered are listed, but only abnormal results are displayed) Labs Reviewed - No data to display  EKG   Radiology No results found.  Procedures Laceration Repair  Date/Time: 03/13/2020 7:53 PM Performed by: Anson Oregon, PA-C Authorized by: Merrilee Jansky, MD   Consent:    Consent obtained:  Verbal   Consent given by:  Patient   Risks discussed:  Infection, pain,  poor cosmetic result, poor wound healing and nerve damage   Alternatives discussed:  Observation Anesthesia (see MAR for exact dosages):    Anesthesia method:  Topical application and local infiltration   Local anesthetic:  Lidocaine 1% WITH epi Laceration details:    Location:  Face   Face location:  Forehead   Length (cm):  2   Depth (mm):  0.5 Repair type:    Repair type:  Simple Pre-procedure details:    Preparation:  Patient was prepped and draped in usual sterile fashion Exploration:    Hemostasis achieved with:  Epinephrine   Contaminated: no   Treatment:    Area cleansed with:  Saline   Amount of cleaning:  Standard   Irrigation solution:  Sterile saline   Irrigation method:  Syringe   Visualized foreign bodies/material removed: no   Skin repair:    Repair method:  Sutures   Suture size:  5-0   Suture material:  Nylon   Suture technique:  Simple interrupted   Number of sutures:  4 Approximation:    Approximation:  Close Post-procedure details:    Dressing:  Bulky dressing   Patient tolerance of procedure:  Tolerated well, no immediate complications   (including critical care time)  Medications Ordered in UC Medications - No data to display  Initial Impression / Assessment and Plan / UC Course  I have reviewed the triage vital signs and the nursing notes.  Pertinent labs & imaging results that were available during my care of the patient were reviewed by me and considered in my medical decision making (see chart for details).     1.  Treatment options were discussed today with the patient. 2.  The laceration was irrigated with sterile saline prior to primary closure using 4 interrupted sutures of 5-0 nylon. 3.  A bulky dressing was applied over the laceration site and Ace wrap applied.  She was instructed to keep the laceration dry until sutures are removed. 4.  Bactrim prescribed for prophylactic antibiotic coverage.  Tetanus is up to date. 5.  Patient with  headache from recent injury but no evidence for possible intra-cranial bleed.  Instructed the patient that if her headache worsens or fails to improve over the next few hours to proceed to the ER. 6.  Follow-up in 10 days for suture removal. Final Clinical Impressions(s) / UC Diagnoses   Final diagnoses:  Laceration of scalp without foreign body, initial encounter     Discharge Instructions     -Take antibiotics as directed. -Keep the laceration dry, keep it covered with a waterproof bandaid when showering. -Follow-up with [facility] in 10 days for suture removal.    ED Prescriptions    Medication Sig Dispense Auth. Provider   sulfamethoxazole-trimethoprim (BACTRIM DS) 800-160 MG tablet Take 1 tablet by mouth 2 (two) times daily for 7 days. 14 tablet Anson Oregon, [place]     PDMP not reviewed this encounter.   Anson Oregon, PA-C 03/13/20 2000

## 2020-03-13 NOTE — ED Triage Notes (Signed)
Patient states that around 530pm today she was leaning over to put up a leash and hit her head on a corner of a metal cabinet/sink. Patient has a laceration to her forehead extending to left eyebrow.

## 2020-03-13 NOTE — Discharge Instructions (Addendum)
-  Take antibiotics as directed. -Keep the laceration dry, keep it covered with a waterproof bandaid when showering. -Follow-up with [facility] in 10 days for suture removal.

## 2020-03-14 ENCOUNTER — Encounter: Payer: Self-pay | Admitting: Family Medicine

## 2020-03-14 ENCOUNTER — Ambulatory Visit (INDEPENDENT_AMBULATORY_CARE_PROVIDER_SITE_OTHER): Payer: BC Managed Care – PPO | Admitting: Family Medicine

## 2020-03-14 VITALS — BP 110/70 | HR 106 | Temp 98.0°F | Ht 60.0 in | Wt 100.8 lb

## 2020-03-14 DIAGNOSIS — M545 Low back pain: Secondary | ICD-10-CM

## 2020-03-14 DIAGNOSIS — G2581 Restless legs syndrome: Secondary | ICD-10-CM

## 2020-03-14 DIAGNOSIS — G8929 Other chronic pain: Secondary | ICD-10-CM

## 2020-03-14 LAB — COMPREHENSIVE METABOLIC PANEL
ALT: 11 U/L (ref 0–35)
AST: 17 U/L (ref 0–37)
Albumin: 4.8 g/dL (ref 3.5–5.2)
Alkaline Phosphatase: 55 U/L (ref 39–117)
BUN: 9 mg/dL (ref 6–23)
CO2: 26 mEq/L (ref 19–32)
Calcium: 9.4 mg/dL (ref 8.4–10.5)
Chloride: 103 mEq/L (ref 96–112)
Creatinine, Ser: 0.73 mg/dL (ref 0.40–1.20)
GFR: 97.79 mL/min (ref >60.00)
Glucose, Bld: 75 mg/dL (ref 70–99)
Potassium: 4 mEq/L (ref 3.5–5.1)
Sodium: 137 mEq/L (ref 135–145)
Total Bilirubin: 0.7 mg/dL (ref 0.2–1.2)
Total Protein: 7.1 g/dL (ref 6.0–8.3)

## 2020-03-14 LAB — FERRITIN: Ferritin: 68.3 ng/mL (ref 10.0–291.0)

## 2020-03-14 NOTE — Patient Instructions (Addendum)
No more than 800 mg of ibuprofen every 8 hours   Back Exercises These exercises help to make your trunk and back strong. They also help to keep the lower back flexible. Doing these exercises can help to prevent back pain or lessen existing pain.  If you have back pain, try to do these exercises 2-3 times each day or as told by your doctor.  As you get better, do the exercises once each day. Repeat the exercises more often as told by your doctor.  To stop back pain from coming back, do the exercises once each day, or as told by your doctor. Exercises Single knee to chest Do these steps 3-5 times in a row for each leg: 1. Lie on your back on a firm bed or the floor with your legs stretched out. 2. Bring one knee to your chest. 3. Grab your knee or thigh with both hands and hold them it in place. 4. Pull on your knee until you feel a gentle stretch in your lower back or buttocks. 5. Keep doing the stretch for 10-30 seconds. 6. Slowly let go of your leg and straighten it. Pelvic tilt Do these steps 5-10 times in a row: 1. Lie on your back on a firm bed or the floor with your legs stretched out. 2. Bend your knees so they point up to the ceiling. Your feet should be flat on the floor. 3. Tighten your lower belly (abdomen) muscles to press your lower back against the floor. This will make your tailbone point up to the ceiling instead of pointing down to your feet or the floor. 4. Stay in this position for 5-10 seconds while you gently tighten your muscles and breathe evenly. Cat-cow Do these steps until your lower back bends more easily: 1. Get on your hands and knees on a firm surface. Keep your hands under your shoulders, and keep your knees under your hips. You may put padding under your knees. 2. Let your head hang down toward your chest. Tighten (contract) the muscles in your belly. Point your tailbone toward the floor so your lower back becomes rounded like the back of a cat. 3. Stay in  this position for 5 seconds. 4. Slowly lift your head. Let the muscles of your belly relax. Point your tailbone up toward the ceiling so your back forms a sagging arch like the back of a cow. 5. Stay in this position for 5 seconds.  Press-ups Do these steps 5-10 times in a row: 1. Lie on your belly (face-down) on the floor. 2. Place your hands near your head, about shoulder-width apart. 3. While you keep your back relaxed and keep your hips on the floor, slowly straighten your arms to raise the top half of your body and lift your shoulders. Do not use your back muscles. You may change where you place your hands in order to make yourself more comfortable. 4. Stay in this position for 5 seconds. 5. Slowly return to lying flat on the floor.  Bridges Do these steps 10 times in a row: 1. Lie on your back on a firm surface. 2. Bend your knees so they point up to the ceiling. Your feet should be flat on the floor. Your arms should be flat at your sides, next to your body. 3. Tighten your butt muscles and lift your butt off the floor until your waist is almost as high as your knees. If you do not feel the muscles working in your  butt and the back of your thighs, slide your feet 1-2 inches farther away from your butt. 4. Stay in this position for 3-5 seconds. 5. Slowly lower your butt to the floor, and let your butt muscles relax. If this exercise is too easy, try doing it with your arms crossed over your chest. Belly crunches Do these steps 5-10 times in a row: 1. Lie on your back on a firm bed or the floor with your legs stretched out. 2. Bend your knees so they point up to the ceiling. Your feet should be flat on the floor. 3. Cross your arms over your chest. 4. Tip your chin a little bit toward your chest but do not bend your neck. 5. Tighten your belly muscles and slowly raise your chest just enough to lift your shoulder blades a tiny bit off of the floor. Avoid raising your body higher than  that, because it can put too much stress on your low back. 6. Slowly lower your chest and your head to the floor. Back lifts Do these steps 5-10 times in a row: 1. Lie on your belly (face-down) with your arms at your sides, and rest your forehead on the floor. 2. Tighten the muscles in your legs and your butt. 3. Slowly lift your chest off of the floor while you keep your hips on the floor. Keep the back of your head in line with the curve in your back. Look at the floor while you do this. 4. Stay in this position for 3-5 seconds. 5. Slowly lower your chest and your face to the floor. Contact a doctor if:  Your back pain gets a lot worse when you do an exercise.  Your back pain does not get better 2 hours after you exercise. If you have any of these problems, stop doing the exercises. Do not do them again unless your doctor says it is okay. Get help right away if:  You have sudden, very bad back pain. If this happens, stop doing the exercises. Do not do them again unless your doctor says it is okay. This information is not intended to replace advice given to you by your health care provider. Make sure you discuss any questions you have with your health care provider. Document Revised: 05/18/2018 Document Reviewed: 05/18/2018 Elsevier Patient Education  2020 [employer].

## 2020-03-14 NOTE — Progress Notes (Signed)
Subjective:    Patient ID: Jenny Cain, female    DOB: 28-Sep-1995, 24 y.o.   MRN: [id number]  HPI Chief Complaint  Patient presents with  . Muscle Pain    injured at work - thrown into a wall and fell - felt hip pop and pain in lower back.   This injury occurred last week. 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 Hip pain resolved. Low back with some continued pain, has had off and on pain for aw, no radiation, middle and right side, no incontinence. Took some Tylenol without relief. Some relief of back pain with ibuprofen.   Was at work yesterday and hit left forehead on cabinet with resulting laceration. Went to ER and had lac repair with 4 sutures. Some continued throbbing.   Review of Systems Per HPI    Objective:   Physical Exam Vitals reviewed.  Constitutional:      Appearance: Normal appearance. She is normal weight.  HENT:     Head:     Comments: Left forehead over eye with laceration, 4 intact sutures, no redness or drainage.  Eyes:     Extraocular Movements: Extraocular movements intact.     Conjunctiva/sclera: Conjunctivae normal.     Pupils: Pupils are equal, round, and reactive to light.  Cardiovascular:     Rate and Rhythm: Normal rate and regular rhythm.     Heart sounds: Normal heart sounds.     Comments: Heart rate normal on auscultation. Pulmonary:     Effort: Pulmonary effort is normal.     Breath sounds: Normal breath sounds.  Musculoskeletal:     Cervical back: Normal.     Thoracic back: Normal.     Lumbar back: No swelling, deformity or bony tenderness. Tenderness: bilateral paraspinals. Normal range of motion. Negative right straight leg raise test and negative left straight leg raise test.     Right lower leg: No edema.     Left lower leg: No edema.     Comments: Normal gait.  Upper extremity/lower extremity strength 5 out of 5.  Skin:    General: Skin is warm and dry.  Neurological:      Mental Status: She is alert and oriented to person, place, and time.     Motor: No weakness.     Gait: Gait normal.     Deep Tendon Reflexes: Reflexes normal.  Psychiatric:        Mood and Affect: Mood normal.        Behavior: Behavior normal.        Thought Content: Thought content normal.        Judgment: Judgment normal.       BP 110/70 (BP Location: Right Arm, Patient Position: Sitting, Cuff Size: Normal)   Pulse (!) 106   Temp 98 F (36.7 C) (Temporal)   Ht 5' (1.524 m)   Wt 100 lb 12.8 oz (45.7 kg)   LMP 03/09/2020   SpO2 97%   BMI 19.69 kg/m  Wt Readings from Last 3 Encounters:  03/14/20 100 lb 12.8 oz (45.7 kg)  03/13/20 102 lb (46.3 kg)  02/11/20 102 lb (46.3 kg)       Assessment & Plan:  1. Restless legs -She has been seen for this previously and left without having having labs done.  We will get labs today. - Comprehensive metabolic panel - Ferritin  2. Chronic bilateral low back pain without sciatica -No worrisome  history or findings on physical exam -Discussed over-the-counter NSAID use, provided verbal and written information regarding back exercises -If worsening or no improvement consider referral to PT  This visit occurred during the SARS-CoV-2 public health emergency.  Safety protocols were in place, including screening questions prior to the visit, additional usage of staff PPE, and extensive cleaning of exam room while observing appropriate contact time as indicated for disinfecting solutions.   Olean Ree, FNP-BC  [facility], [facility]  03/14/2020 12:50 PM

## 2020-04-25 ENCOUNTER — Encounter: Payer: Self-pay | Admitting: Family Medicine

## 2021-01-19 ENCOUNTER — Emergency Department (HOSPITAL_COMMUNITY)
Admission: EM | Admit: 2021-01-19 | Discharge: 2021-01-19 | Disposition: A | Discharge status: Home / Self Care | Payer: BC Managed Care – PPO | Attending: Emergency Medicine | Admitting: Emergency Medicine

## 2021-01-19 ENCOUNTER — Emergency Department (HOSPITAL_COMMUNITY): Payer: BC Managed Care – PPO

## 2021-01-19 ENCOUNTER — Other Ambulatory Visit: Payer: Self-pay

## 2021-01-19 ENCOUNTER — Encounter (HOSPITAL_COMMUNITY): Payer: Self-pay

## 2021-01-19 DIAGNOSIS — J45909 Unspecified asthma, uncomplicated: Secondary | ICD-10-CM | POA: Insufficient documentation

## 2021-01-19 DIAGNOSIS — N83201 Unspecified ovarian cyst, right side: Secondary | ICD-10-CM | POA: Diagnosis not present

## 2021-01-19 DIAGNOSIS — Z87891 Personal history of nicotine dependence: Secondary | ICD-10-CM | POA: Insufficient documentation

## 2021-01-19 DIAGNOSIS — R102 Pelvic and perineal pain: Secondary | ICD-10-CM

## 2021-01-19 DIAGNOSIS — D72829 Elevated white blood cell count, unspecified: Secondary | ICD-10-CM | POA: Insufficient documentation

## 2021-01-19 DIAGNOSIS — R1031 Right lower quadrant pain: Secondary | ICD-10-CM | POA: Diagnosis present

## 2021-01-19 LAB — CBC
HCT: 41.9 % (ref 36.0–46.0)
Hemoglobin: 14.7 g/dL (ref 12.0–15.0)
MCH: 33.3 pg (ref 26.0–34.0)
MCHC: 35.1 g/dL (ref 30.0–36.0)
MCV: 95 fL (ref 80.0–100.0)
Platelets: 198 10*3/uL (ref 150–400)
RBC: 4.41 MIL/uL (ref 3.87–5.11)
RDW: 11.5 % (ref 11.5–15.5)
WBC: 11.9 10*3/uL — ABNORMAL HIGH (ref 4.0–10.5)
nRBC: 0 % (ref 0.0–0.2)

## 2021-01-19 LAB — URINALYSIS, ROUTINE W REFLEX MICROSCOPIC
Bilirubin Urine: NEGATIVE
Glucose, UA: NEGATIVE mg/dL
Ketones, ur: NEGATIVE mg/dL
Leukocytes,Ua: NEGATIVE
Nitrite: NEGATIVE
Protein, ur: NEGATIVE mg/dL
Specific Gravity, Urine: 1.012 (ref 1.005–1.030)
pH: 5 (ref 5.0–8.0)

## 2021-01-19 LAB — COMPREHENSIVE METABOLIC PANEL
ALT: 19 U/L (ref 0–44)
AST: 39 U/L (ref 15–41)
Albumin: 4.4 g/dL (ref 3.5–5.0)
Alkaline Phosphatase: 45 U/L (ref 38–126)
Anion gap: 9 (ref 5–15)
BUN: 10 mg/dL (ref 6–20)
CO2: 23 mmol/L (ref 22–32)
Calcium: 9 mg/dL (ref 8.9–10.3)
Chloride: 103 mmol/L (ref 98–111)
Creatinine, Ser: 0.57 mg/dL (ref 0.44–1.00)
GFR, Estimated: >60 mL/min (ref >60)
Glucose, Bld: 93 mg/dL (ref 70–99)
Potassium: 4.4 mmol/L (ref 3.5–5.1)
Sodium: 135 mmol/L (ref 135–145)
Total Bilirubin: 0.6 mg/dL (ref 0.3–1.2)
Total Protein: 6.8 g/dL (ref 6.5–8.1)

## 2021-01-19 LAB — I-STAT BETA HCG BLOOD, ED (MC, WL, AP ONLY): I-stat hCG, quantitative: <5 m[IU]/mL (ref <5)

## 2021-01-19 LAB — LIPASE, BLOOD: Lipase: 34 U/L (ref 11–51)

## 2021-01-19 MED ORDER — IBUPROFEN 800 MG PO TABS
800.0000 mg | ORAL_TABLET | Freq: Once | ORAL | Status: AC
  Administered 2021-01-19: 800 mg via ORAL
  Filled 2021-01-19 (×2): qty 1

## 2021-01-19 MED ORDER — ONDANSETRON 4 MG PO TBDP
4.0000 mg | ORAL_TABLET | Freq: Once | ORAL | Status: AC
  Administered 2021-01-19: 4 mg via ORAL
  Filled 2021-01-19: qty 1

## 2021-01-19 NOTE — ED Triage Notes (Signed)
Pt reports lower abd pain starting this am, unable to urinate this am. Pt denies hx of kidney stone, frequency or urgency

## 2021-01-19 NOTE — ED Provider Notes (Incomplete)
I saw and evaluated the patient, reviewed the resident's note and I agree with the findings and plan.       Alice Vitelli, MD 06/20/22 1140  

## 2021-01-19 NOTE — ED Provider Notes (Signed)
[facility]   CSN: [id number] Arrival date & time: 01/19/21  0935     History Chief Complaint  Patient presents with  . Abdominal Pain    Jenny Cain is a 25 y.o. female.  HPI 25 year old female with history of premature birth, seizures, migraines, and asthma presents emergency department for right lower quadrant abdominal pain.  States it started overnight and woke her up out of sleep.  Was sudden in onset.  Does not radiate.  Was sharp and severe.  Still states it is moderate to severe.  Denies history of pain like this previously.  Last menstrual period 12/24/2020.  States that it hurts so bad that she is having trouble urinating.  Denies dysuria.  Denies vaginal discharge, itching, or bleeding.  Denies new sexual partners.  States she gets tested regularly for STDs and does not want testing at this time.  Has not taken anything for the pain.  Also endorses nausea.  Denies fevers or flank pain.    Past Medical History:  Diagnosis Date  . Anxiety   . Asthma   . Insomnia   . Migraine   . Premature baby   . Seizures [facility](H[facility])     Patient Active Problem List   Diagnosis Date Noted  . Prematurity 02/11/2020  . Asthma 03/26/2019  . Headache(784.0) 02/12/2013  . Posttraumatic headache 02/12/2013  . Depression 02/12/2013  . Anxiety state, unspecified 02/12/2013  . Insomnia 02/12/2013  . Memory changes 02/12/2013    Past Surgical History:  Procedure Laterality Date  . NISSEN FUNDOPLICATION       OB History   No obstetric history on file.     Family History  Problem Relation Age of Onset  . Depression Mother   . Migraines Mother   . Depression Father   . Multiple sclerosis Father   . Heart attack Maternal Grandmother   . Heart attack Paternal Grandmother   . Breast cancer Maternal Aunt     Social History   Tobacco Use  . Smoking status: Former Smoker    Years: 2.00    Types: Cigarettes  . Smokeless  tobacco: Never Used  . Tobacco comment: smoked in high school  Vaping Use  . Vaping Use: Some days  . Devices: disposable  Substance Use Topics  . Alcohol use: Yes    Comment: once a month  . Drug use: No    Home Medications Prior to Admission medications   Medication Sig Start Date End Date Taking? Authorizing Provider  albuterol (PROAIR HFA) 108 (90 Base) MCG/ACT inhaler ProAir HFA 90 mcg/actuation aerosol inhaler  INHALE 1 TO 2 PUFFS BY MOUTH EVERY 4 TO 6 HOURS AS NEEDED    [provider]  Ascorbic Acid (VITAMIN C) 1000 MG tablet Take 1,000 mg by mouth daily.    [provider]  Cholecalciferol (VITAMIN D3) 25 MCG (1000 UT) CAPS Take 1 capsule by mouth daily.    [provider]  gabapentin (NEURONTIN) 100 MG capsule Take 1-3 capsules (100-300 mg total) by mouth at bedtime. 02/11/20   Emi BelfastGessner, Deborah B, FNP  Melatonin 5 MG TABS Take by mouth.    [provider]  sulfamethoxazole-trimethoprim (BACTRIM DS) 800-160 MG tablet Take 1 tablet by mouth 2 (two) times daily.    [provider]    Allergies    Amoxicillin, Cefprozil, and Penicillins  Review of Systems   Review of Systems  Constitutional: Negative for chills and fever.  HENT: Negative for ear pain and sore throat.   Eyes: Negative for pain and visual disturbance.  Respiratory: Negative for cough and shortness of breath.   Cardiovascular: Negative for chest pain and palpitations.  Gastrointestinal: Positive for abdominal pain and nausea. Negative for vomiting.  Genitourinary: Positive for difficulty urinating. Negative for dysuria and hematuria.  Musculoskeletal: Negative for arthralgias and back pain.  Skin: Negative for color change and rash.  Neurological: Negative for seizures and syncope.  Psychiatric/Behavioral: Positive for sleep disturbance.  All other systems reviewed and are negative.   Physical Exam Updated Vital Signs BP (!) 138/96 (BP Location: Right Arm)    Pulse 86   Temp 98.9 F (37.2 C) (Oral)   Resp 18   SpO2 100%   Physical Exam Vitals and nursing note reviewed.  Constitutional:      General: She is not in acute distress.    Appearance: She is well-developed.  HENT:     Head: Normocephalic and atraumatic.  Eyes:     Extraocular Movements: Extraocular movements intact.     Conjunctiva/sclera: Conjunctivae normal.  Cardiovascular:     Rate and Rhythm: Normal rate and regular rhythm.     Heart sounds: Normal heart sounds. No murmur heard.   Pulmonary:     Effort: Pulmonary effort is normal. No respiratory distress.     Breath sounds: Normal breath sounds.  Abdominal:     General: Abdomen is flat. Bowel sounds are increased.     Palpations: Abdomen is soft.     Tenderness: There is abdominal tenderness in the right lower quadrant. There is guarding. There is no right CVA tenderness, left CVA tenderness or rebound.  Genitourinary:    Comments: Politely declined pelvic exam Musculoskeletal:     Cervical back: Neck supple.  Skin:    General: Skin is warm and dry.     Capillary Refill: Capillary refill takes less than 2 seconds.  Neurological:     General: No focal deficit present.     Mental Status: She is alert and oriented to person, place, and time.  Psychiatric:        Mood and Affect: Mood normal.        Behavior: Behavior normal.     ED Results / Procedures / Treatments   Labs (all labs ordered are listed, but only abnormal results are displayed) Labs Reviewed  CBC - Abnormal; Notable for the following components:      Result Value   WBC 11.9 (*)    All other components within normal limits  URINALYSIS, ROUTINE W REFLEX MICROSCOPIC - Abnormal; Notable for the following components:   Hgb urine dipstick SMALL (*)    Bacteria, UA RARE (*)    All other components within normal limits  LIPASE, BLOOD  COMPREHENSIVE METABOLIC PANEL  I-STAT BETA HCG BLOOD, ED ([facility], [facility], [facility] ONLY)    EKG None  Radiology CT  ABDOMEN PELVIS WO CONTRAST  Result Date: 01/19/2021 CLINICAL DATA:  Right lower quadrant abdominal pain. Appendicitis suspected. Right flank pain increasing today. Onset of symptoms this morning. Unable to urinate this morning. EXAM: CT ABDOMEN AND PELVIS WITHOUT CONTRAST TECHNIQUE: Multidetector CT imaging of the abdomen and pelvis was performed following the standard protocol without IV contrast. COMPARISON:  A pelvic ultrasound same date. Abdominopelvic CT 06/30/2016. FINDINGS: Lower chest: Clear lung bases. No significant pleural or pericardial effusion. Hepatobiliary: No focal hepatic abnormalities are identified on noncontrast imaging. No evidence of gallstones, gallbladder wall thickening or biliary dilatation. Pancreas:  Unremarkable. No pancreatic ductal dilatation or surrounding inflammatory changes. Spleen: Normal in size without focal abnormality. Adrenals/Urinary Tract: Both adrenal glands appear normal. No evidence of urinary tract calculus, hydronephrosis or perinephric soft tissue stranding. The bladder is mildly distended without wall thickening or surrounding inflammation. Stomach/Bowel: No enteric contrast was administered. There are stable postsurgical changes at the gastroesophageal junction consistent with prior Nissen fundoplication. The stomach appears unremarkable for its degree of distension. No evidence of bowel wall thickening, distention or surrounding inflammatory change. The appendix appears normal. There is moderate stool in the right colon. Vascular/Lymphatic: There are no enlarged abdominal or pelvic lymph nodes. No significant vascular findings on noncontrast imaging. Reproductive: The uterus appears unremarkable. The complex right ovarian lesion seen on earlier ultrasound is not easily differentiated from adjacent unopacified bowel and pelvic fluid. Other: Small to moderate amount of free pelvic fluid without high-density components. No free air or focal extraluminal fluid  collection. The anterior abdominal wall is intact. Musculoskeletal: No acute or significant osseous findings. IMPRESSION: 1. Small to moderate amount of free pelvic fluid associated with a complex right adnexal lesion on preceding ultrasound, suggesting possible ovarian cyst rupture. 2. The appendix appears normal. 3. No evidence of urinary tract calculus or hydronephrosis. 4. Prior Nissen fundoplication. Electronically Signed   By: Carey Bullocks M.D.   On: 01/19/2021 13:46   US PELVIC COMPLETE W TRANSVAGINAL AND TORSION R/O  Result Date: 01/19/2021 CLINICAL DATA:  Pelvic pain EXAM: TRANSABDOMINAL AND TRANSVAGINAL ULTRASOUND OF PELVIS DOPPLER ULTRASOUND OF OVARIES TECHNIQUE: Both transabdominal and transvaginal ultrasound examinations of the pelvis were performed. Transabdominal technique was performed for global imaging of the pelvis including uterus, ovaries, adnexal regions, and pelvic cul-de-sac. It was necessary to proceed with endovaginal exam following the transabdominal exam to visualize the uterus, endometrium, ovaries and adnexa. Color and duplex Doppler ultrasound was utilized to evaluate blood flow to the ovaries. COMPARISON:  07/24/2008 FINDINGS: Uterus Measurements: 8.0 x 3.6 x 4.2 cm = volume: 63 mL. No fibroids or other mass visualized. Endometrium Thickness: 13 mm in thickness.  No focal abnormality visualized. Right ovary Measurements: 6.9 x 3.0 x 3.3 cm = volume: 35 mL. 3.2 cm cystic area with septations and low-level internal echoes, likely hemorrhagic cyst. Left ovary Measurements: 3.5 x 1.4 x 2.5 cm = volume: 6.2 mL. Normal appearance/no adnexal mass. Pulsed Doppler evaluation of both ovaries demonstrates normal low-resistance arterial and venous waveforms. Other findings Trace free fluid. IMPRESSION: 3.2 cm complex cyst in the right ovary, likely hemorrhagic cyst. No evidence of torsion. Electronically Signed   By: Charlett Nose M.D.   On: 01/19/2021 11:38    Procedures Procedures    Medications Ordered in ED Medications  ibuprofen (ADVIL) tablet 800 mg (800 mg Oral Given 01/19/21 1249)  ondansetron (ZOFRAN-ODT) disintegrating tablet 4 mg (4 mg Oral Given 01/19/21 1257)    ED Course  I have reviewed the triage vital signs and the nursing notes.  Pertinent labs & imaging results that were available during my care of the patient were reviewed by me and considered in my medical decision making (see chart for details).    MDM Rules/Calculators/A&P                         25 year old female presents emergency department for right lower quadrant abdominal pain and nausea.  Hemodynamically stable, not in acute distress.  On exam, she is very tender in her right lower quadrant.  Does not have peritonitis,  but is guarding a little in the lower quadrant.  No rebound tenderness.  Abdomen remains very soft.  Bowel sounds are hyperactive.  No other findings on exam.  Differential includes ovarian cyst, torsion, ectopic pregnancy, pelvic inflammatory disease, nephrolithiasis, pyelonephritis, cystitis, appendicitis.    Triage ultrasound was performed prior to my evaluation, shows likely hemorrhagic cyst in the right adnexa.  Review of CT scan from 2017 showed a hemorrhagic cyst in the left adnexa.  Does not seem if she is ever been symptomatic from these before.  Due to significant abdominal tenderness along with nausea, do think she warrants CT abdomen and pelvis.  Per hospital regulations at this time, without contrast was ordered due to national contrast shortage.  Ibuprofen was ordered in triage and given for pain.  Review of labs showed leukocytosis on CBC.  Pregnancy test is negative.  CMP and lipase unremarkable.  CT showed no acute finding other than the known hemorrhagic cyst.  Urinalysis unremarkable.  I discussed findings with patient and her mom. They feel comfortable going home.  We discussed symptomatic management and return precautions.  Recommended close PCP  follow-up.  Work note provided.  Patient discharged in stable condition.   Final Clinical Impression(s) / ED Diagnoses Final diagnoses:  Pelvic pain  Cyst of right ovary    Rx / DC Orders ED Discharge Orders    None       Louretta Parma, DO 01/19/21 1525    Arby Barrette, MD 01/23/21 [phone]

## 2021-01-19 NOTE — Discharge Instructions (Signed)
Alternate ibuprofen and Tylenol as needed for pain.  Heat and ice can help as well. Follow-up with your normal doctor/OB-Gyn doctor.

## 2021-01-19 NOTE — ED Provider Notes (Signed)
Emergency Medicine Provider Triage Evaluation Note  Jenny Cain , a 25 y.o. female  was evaluated in triage.  Pt complains of sudden onset RLQ/ right pelvic pain. LMP 12/24/20.  Review of Systems  Positive: R pelvic pain, N, Dysuria Negative: Vaginal bleeding, discharge, V, D, Fevers, chills  Physical Exam  BP (!) 146/96 (BP Location: Right Arm)   Pulse 97   Temp 98.3 F (36.8 C) (Oral)   Resp 18   SpO2 100%  Gen:   Awake, no distress   Resp:  Normal effort  MSK:   Moves extremities without difficulty  Other:  RLQ/right pelvis TTP, no rebound or guarding. Suprapubic pain  Medical Decision Making  Medically screening exam initiated at 10:02 AM.  Appropriate orders placed.  Cassidy Tashiro Virgin was informed that the remainder of the evaluation will be completed by another provider, this initial triage assessment does not replace that evaluation, and the importance of remaining in the ED until their evaluation is complete.  This chart was dictated using voice recognition software, Dragon. Despite the best efforts of this provider to proofread and correct errors, errors may still occur which can change documentation meaning.    Paris Lore, PA-C 01/19/21 1003    Milagros Loll, MD 01/20/21 1137

## 2021-01-29 ENCOUNTER — Other Ambulatory Visit: Payer: Self-pay

## 2021-01-29 ENCOUNTER — Other Ambulatory Visit (INDEPENDENT_AMBULATORY_CARE_PROVIDER_SITE_OTHER): Payer: BC Managed Care – PPO

## 2021-01-29 ENCOUNTER — Telehealth (INDEPENDENT_AMBULATORY_CARE_PROVIDER_SITE_OTHER): Payer: BC Managed Care – PPO | Admitting: Family Medicine

## 2021-01-29 ENCOUNTER — Encounter: Payer: Self-pay | Admitting: Family Medicine

## 2021-01-29 ENCOUNTER — Other Ambulatory Visit: Payer: Self-pay | Admitting: Family Medicine

## 2021-01-29 DIAGNOSIS — Z20822 Contact with and (suspected) exposure to covid-19: Secondary | ICD-10-CM

## 2021-01-29 DIAGNOSIS — J069 Acute upper respiratory infection, unspecified: Secondary | ICD-10-CM | POA: Diagnosis not present

## 2021-01-29 DIAGNOSIS — J029 Acute pharyngitis, unspecified: Secondary | ICD-10-CM

## 2021-01-29 LAB — POCT RAPID STREP A (OFFICE): Rapid Strep A Screen: NEGATIVE

## 2021-01-29 LAB — POC INFLUENZA A&B (BINAX/QUICKVUE)
Influenza A, POC: NEGATIVE
Influenza B, POC: NEGATIVE

## 2021-01-29 NOTE — Progress Notes (Signed)
Virtual Visit via Video Note  I connected with Jenny Cain on 01/29/21 at 11:00 AM EDT by a video enabled telemedicine application and verified that I am speaking with the correct person using two identifiers.  Location: Patient: home Provider: office   I discussed the limitations of evaluation and management by telemedicine and the availability of in person appointments. The patient expressed understanding and agreed to proceed.  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   Tuesday woke up with bad headache (migraine)  ST- worse on the R (worse at night)  Hard to swallow last night  Cough - is dry  No sob  Is very congested No wheezing or chest pain  Some sinus pressure under eyes   Some nausea no vomiting  Had baseline diarrhea (IBS)  Has not checked temp /no way to do it  Had body aches No chills   Home covid test yesterday neg   Several others got sick at work    Fluids Chicken broth  covid imm status    otc  Tylenol cold and flu -helped temporarily  Used the vics - for congestion ns    covid -had shots - without booster     Patient Active Problem List   Diagnosis Date Noted  . URI with cough and congestion 01/29/2021  . Sore throat 01/29/2021  . Exposure to confirmed case of COVID-19 01/29/2021  . Prematurity 02/11/2020  . Asthma 03/26/2019  . Headache(784.0) 02/12/2013  . Posttraumatic headache 02/12/2013  . Depression 02/12/2013  . Anxiety state, unspecified 02/12/2013  . Insomnia 02/12/2013  . Memory changes 02/12/2013   Past Medical History:  Diagnosis Date  . Anxiety   . Asthma   . Insomnia   . Migraine   . Premature baby   . Seizures (HCC)    Past Surgical History:  Procedure  Laterality Date  . NISSEN FUNDOPLICATION     Social History   Tobacco Use  . Smoking status: Former Smoker    Years: 2.00    Types: Cigarettes  . Smokeless tobacco: Never Used  . Tobacco comment: smoked in high school  Vaping Use  . Vaping Use: Some days  . Devices: disposable  Substance Use Topics  . Alcohol use: Yes    Comment: once a month  . Drug use: No   Family History  Problem Relation Age of Onset  . Depression Mother   . Migraines Mother   . Depression Father   . Multiple sclerosis Father   . Heart attack Maternal Grandmother   . Heart attack Paternal Grandmother   . Breast cancer Maternal Aunt    Allergies  Allergen Reactions  . Amoxicillin Swelling  . Cefprozil   . Penicillins    Current Outpatient Medications on File Prior to Visit  Medication Sig Dispense Refill  . albuterol (VENTOLIN HFA) 108 (90 Base) MCG/ACT inhaler ProAir HFA 90 mcg/actuation aerosol inhaler  INHALE 1 TO 2 PUFFS BY MOUTH EVERY 4 TO 6 HOURS AS NEEDED    . Ascorbic Acid (VITAMIN C) 1000 MG tablet Take 1,000 mg by mouth daily.    . Cholecalciferol (VITAMIN D3) 25 MCG (1000 UT) CAPS Take 1 capsule by mouth daily.    Marland Kitchen  Melatonin 5 MG TABS Take by mouth.     No current facility-administered medications on file prior to visit.   Review of Systems  Constitutional: Positive for chills and malaise/fatigue. Negative for fever.  HENT: Positive for congestion and sore throat. Negative for ear pain and sinus pain.   Eyes: Negative for blurred vision, discharge and redness.  Respiratory: Positive for cough. Negative for sputum production, shortness of breath, wheezing and stridor.   Cardiovascular: Negative for chest pain, palpitations and leg swelling.  Gastrointestinal: Negative for abdominal pain, diarrhea, nausea and vomiting.  Musculoskeletal: Negative for myalgias.  Skin: Negative for rash.  Neurological: Positive for headaches. Negative for dizziness.    Observations/Objective: Patient  appears well, in no distress Weight is baseline  No facial swelling or asymmetry Hoarse voice  No obvious tremor or mobility impairment Moving neck and UEs normally Able to hear the call well  Occasional dry sounding cough, no wheezing or sob during conversation  Talkative and mentally sharp with no cognitive changes No skin changes on face or neck , no rash or pallor Affect is normal    Assessment and Plan: Problem List Items Addressed This Visit      Respiratory   URI with cough and congestion - Primary    covid and flu tests ordered  Disc symptom control  ER parameters discussed  Had close exp to covid          Other   Sore throat    With respiratory symptoms in setting of covid exposure in immunized pt  Strep , flu and covid tests ordered      Exposure to confirmed case of COVID-19    Now symptomatic covid test ordered          Follow Up Instructions: Drink fluids and rest  Treat your symptoms  Saline nasal spray may help congestion  Don't use the vics nasal spray for more than 2 days  Ibuprofen (with food) may help headache   Call if symptoms worsen, especially if you develop wheezing If severe, go to ER   The office will call you to set up testing for flu and covid and strep    I discussed the assessment and treatment plan with the patient. The patient was provided an opportunity to ask questions and all were answered. The patient agreed with the plan and demonstrated an understanding of the instructions.   The patient was advised to call back or seek an in-person evaluation if the symptoms worsen or if the condition fails to improve as anticipated.     Roxy Manns, MD

## 2021-01-29 NOTE — Patient Instructions (Signed)
Drink fluids and rest  Treat your symptoms  Saline nasal spray may help congestion  Don't use the vics nasal spray for more than 2 days  Ibuprofen (with food) may help headache   Call if symptoms worsen, especially if you develop wheezing If severe, go to ER   The office will call you to set up testing for flu and covid and strep

## 2021-01-29 NOTE — Assessment & Plan Note (Signed)
With respiratory symptoms in setting of covid exposure in immunized pt  Strep , flu and covid tests ordered

## 2021-01-29 NOTE — Assessment & Plan Note (Signed)
Now symptomatic covid test ordered

## 2021-01-29 NOTE — Assessment & Plan Note (Signed)
covid and flu tests ordered  Disc symptom control  ER parameters discussed  Had close exp to covid

## 2021-01-31 LAB — SARS-COV-2, NAA 2 DAY TAT

## 2021-01-31 LAB — NOVEL CORONAVIRUS, NAA: SARS-CoV-2, NAA: NOT DETECTED

## 2021-05-27 ENCOUNTER — Other Ambulatory Visit: Payer: Self-pay

## 2021-05-27 ENCOUNTER — Ambulatory Visit
Admission: RE | Admit: 2021-05-27 | Discharge: 2021-05-27 | Disposition: A | Discharge status: Home / Self Care | Payer: BC Managed Care – PPO | Source: Ambulatory Visit | Attending: Emergency Medicine | Admitting: Emergency Medicine

## 2021-05-27 VITALS — BP 120/89 | HR 110 | Temp 99.0°F | Resp 18

## 2021-05-27 DIAGNOSIS — B839 Helminthiasis, unspecified: Secondary | ICD-10-CM

## 2021-05-27 MED ORDER — ALBENDAZOLE 200 MG PO TABS: ORAL_TABLET | ORAL | 0 refills | Status: DC

## 2021-05-27 NOTE — ED Notes (Signed)
Given supplies to obtain specimen

## 2021-05-27 NOTE — ED Triage Notes (Signed)
Thinks she saw a worm in stool.  Patient does work at a [employer].  No change in stool consistency, patient has noticed abdominal cramping that is not characteristic of her normal routine

## 2021-05-27 NOTE — Discharge Instructions (Addendum)
Take albendazole as prescribed and repeat in 14 days.  Bring back stool sample as directed.  If you begin to have any worsening abdominal pain, blood in stool, fever, chills, vomiting or worsening symptoms return to clinic for reevaluation.

## 2021-05-27 NOTE — ED Provider Notes (Signed)
Chief Complaint   Chief Complaint  Patient presents with   Appointment     Subjective, HPI  Jenny Cain is a very pleasant 25 y.o. female who presents with concerns for a worm in her stool.  Patient reports that she works at a [employer].  No change in stool consistency, but has noticed some abdominal cramping which is part of her usual routine.  Patient does report that she took a picture of the worm for which she saw on her stool yesterday.  No additional symptoms such as bleeding with stooling, vomiting or fever.  History obtained from patient.   Patient's problem list, past medical and social history, medications, and allergies were reviewed by me and updated in Epic.    ROS  See HPI.  Objective   Vitals:   05/27/21 0859  BP: 120/89  Pulse: (!) 110  Resp: 18  Temp: 99 F (37.2 C)  SpO2: 96%    Vital signs and nursing note reviewed.   Patient's last menstrual period was 05/18/2021.   General: Appears well-developed and well-nourished. No acute distress.  HEENT Head: Normocephalic and atraumatic.  Eyes: Conjunctivae and EOM are normal. No eye drainage or scleral icterus bilaterally.  Ears: Hearing grossly intact, no drainage or visible deformity.  Neck: Normal range of motion, neck is supple.  Cardiovascular: Normal rate Pulm/Chest: No respiratory distress Neurological: Alert and oriented to person, place, and time.  Skin: Skin is warm and dry.  Psychiatric: Normal mood, affect, behavior, and thought content.   Assessment & Plan  1. Worms in stool - Gastrointestinal Panel by PCR , Stool; Standing - Gastrointestinal Panel by PCR , Stool - albendazole (ALBENZA) 200 MG tablet; Take 2 tablets (400mg ) today then another 2 tablets (400mg ) in 14 days.  Dispense: 4 tablet; Refill: 0   25 y.o. female presents with concerns for a worm in her stool.  Patient reports that she works at a .  No change in stool consistency, but has noticed some  abdominal cramping which is part of her usual routine.  Patient does report that she took a picture of the worm for which she saw on her stool yesterday.  No additional symptoms such as bleeding with stooling, vomiting or fever.  Stool culture ordered and advised to bring the stool sample back.  The patient did show me a picture of what she found in her stool which does appear to be a worm.  Rx'd albendazole to her preferred pharmacy and advised to take 400 mg today and then another 400 mg in 2 weeks.  Advised that we would treat if we find any additional abnormalities with her stool culture.  Return for any worsening abdominal pain, blood in stool, fever, chills, vomiting or worsening symptoms.  Patient verbalized understanding and agreed with plan.  Patient stable upon discharge.  Work note provided.  Plan:   Discharge Instructions      Take albendazole as prescribed and repeat in 14 days.  Bring back stool sample as directed.  If you begin to have any worsening abdominal pain, blood in stool, fever, chills, vomiting or worsening symptoms return to clinic for reevaluation.         22, Statistician 05/27/21 [phone]

## 2021-07-02 ENCOUNTER — Telehealth: Payer: BC Managed Care – PPO | Admitting: Physician Assistant

## 2021-07-02 DIAGNOSIS — H66001 Acute suppurative otitis media without spontaneous rupture of ear drum, right ear: Secondary | ICD-10-CM

## 2021-07-02 DIAGNOSIS — J069 Acute upper respiratory infection, unspecified: Secondary | ICD-10-CM | POA: Diagnosis not present

## 2021-07-02 MED ORDER — BENZONATATE 100 MG PO CAPS: 100.0000 mg | ORAL_CAPSULE | Freq: Three times a day (TID) | ORAL | 0 refills | Status: DC | PRN

## 2021-07-02 MED ORDER — AZITHROMYCIN 250 MG PO TABS: ORAL_TABLET | ORAL | 0 refills | Status: AC

## 2021-07-02 MED ORDER — FLUTICASONE PROPIONATE 50 MCG/ACT NA SUSP: 2.0000 | Freq: Every day | NASAL | 0 refills | Status: DC

## 2021-07-02 NOTE — Patient Instructions (Addendum)
  [name], thank you for joining Piedad Climes, PA-C for today's virtual visit.  While this provider is not your primary care provider (PCP), if your PCP is located in our provider database this encounter information will be shared with them immediately following your visit.  Consent: (Patient) Jenny Cain provided verbal consent for this virtual visit at the beginning of the encounter.  Current Medications:  Current Outpatient Medications:    azithromycin (ZITHROMAX) 250 MG tablet, Take 2 tablets on day 1, then 1 tablet daily on days 2 through 5, Disp: 6 tablet, Rfl: 0   benzonatate (TESSALON) 100 MG capsule, Take 1 capsule (100 mg total) by mouth 3 (three) times daily as needed for cough., Disp: 30 capsule, Rfl: 0   fluticasone (FLONASE) 50 MCG/ACT nasal spray, Place 2 sprays into both nostrils daily., Disp: 16 g, Rfl: 0   Medications ordered in this encounter:  Meds ordered this encounter  Medications   fluticasone (FLONASE) 50 MCG/ACT nasal spray    Sig: Place 2 sprays into both nostrils daily.    Dispense:  16 g    Refill:  0    Order Specific Question:   Supervising Provider    Answer:   MILLER, BRIAN [[id number]]   benzonatate (TESSALON) 100 MG capsule    Sig: Take 1 capsule (100 mg total) by mouth 3 (three) times daily as needed for cough.    Dispense:  30 capsule    Refill:  0    Order Specific Question:   Supervising Provider    Answer:   MILLER, BRIAN [[id number]]   azithromycin (ZITHROMAX) 250 MG tablet    Sig: Take 2 tablets on day 1, then 1 tablet daily on days 2 through 5    Dispense:  6 tablet    Refill:  0    Order Specific Question:   Supervising Provider    Answer:   Eber Hong [[id number]]     *If you need refills on other medications prior to your next appointment, please contact your pharmacy*  Follow-Up: Call back or seek an in-person evaluation if the symptoms worsen or if the condition fails to improve as anticipated.  Other  Instructions Please continue to stay well-hydrated and get plenty of rest. Place a humidifier in the bedroom and run at night. Alternate tylenol and ibuprofen if needed for headache, aches, fever or sore throat Salt-water gargles can be beneficial. Use the Flonase and Tessalon as directed. Okay to continue your OTC medications. Take the antibiotic as directed for ear infection.  Remember to let us know if COVID test is positive. You should quarantine until results are in and next steps can be determined.    If you have been instructed to have an in-person evaluation today at a local Urgent Care facility, please use the link below. It will take you to a list of all of our available [facility], including address, phone number and hours of operation. Please do not delay care.  [facility]  If you or a family member do not have a primary care provider, use the link below to schedule a visit and establish care. When you choose a [facility] primary care physician or advanced practice provider, you gain a long-term partner in health. Find a Primary Care Provider  Learn more about [facility]'s in-office and virtual care options: [facility] - Get Care Now

## 2021-07-02 NOTE — Progress Notes (Signed)
Virtual Visit Consent   Jenny Cain, you are scheduled for a virtual visit with a [facility] provider today.     Just as with appointments in the office, your consent must be obtained to participate.  Your consent will be active for this visit and any virtual visit you may have with one of our providers in the next 365 days.     If you have a MyChart account, a copy of this consent can be sent to you electronically.  All virtual visits are billed to your insurance company just like a traditional visit in the office.    As this is a virtual visit, video technology does not allow for your provider to perform a traditional examination.  This may limit your provider's ability to fully assess your condition.  If your provider identifies any concerns that need to be evaluated in person or the need to arrange testing (such as labs, EKG, etc.), we will make arrangements to do so.     Although advances in technology are sophisticated, we cannot ensure that it will always work on either your end or our end.  If the connection with a video visit is poor, the visit may have to be switched to a telephone visit.  With either a video or telephone visit, we are not always able to ensure that we have a secure connection.     I need to obtain your verbal consent now.   Are you willing to proceed with your visit today?    Jenny Cain has provided verbal consent on 07/02/2021 for a virtual visit (video or telephone).   Piedad Climes, [place]   Date: 07/02/2021 3:28 PM   Virtual Visit via Video Note   I, Piedad Climes, [name]  ([id number], 1995/12/30) on 07/02/21 at  3:15 PM EDT by a video-enabled telemedicine application and verified that I am speaking with the correct person using two identifiers.  Location: Patient: Virtual Visit Location Patient: Home Provider: Virtual Visit Location Provider: Home Office   I discussed the limitations of evaluation  and management by telemedicine and the availability of in person appointments. The patient expressed understanding and agreed to proceed.    History of Present Illness: Jenny Cain is a 25 y.o. who identifies as a female who was assigned female at birth, and is being seen today for URI symptoms. Endorses symptoms starting after visiting a haunted attraction this past weekend. Notes fever (Tmax 100.4), aches, ear pain (R). Now with voice hoarseness and as of today is having aches, chills. Denies recent travel or sick contact. Has been taking Mucinex, Vapor Rub and some OTC cold and flu medication.  HPI: HPI  Problems:  Patient Active Problem List   Diagnosis Date Noted   URI with cough and congestion 01/29/2021   Sore throat 01/29/2021   Exposure to confirmed case of COVID-19 01/29/2021   Prematurity 02/11/2020   Asthma 03/26/2019   Headache(784.0) 02/12/2013   Posttraumatic headache 02/12/2013   Depression 02/12/2013   Anxiety state, unspecified 02/12/2013   Insomnia 02/12/2013   Memory changes 02/12/2013    Allergies:  Allergies  Allergen Reactions   Amoxicillin Swelling   Cefprozil    Penicillins    Medications:  Current Outpatient Medications:    azithromycin (ZITHROMAX) 250 MG tablet, Take 2 tablets on day 1, then 1 tablet daily on days 2 through 5, Disp: 6 tablet, Rfl: 0   benzonatate (TESSALON) 100 MG capsule,  Take 1 capsule (100 mg total) by mouth 3 (three) times daily as needed for cough., Disp: 30 capsule, Rfl: 0   fluticasone (FLONASE) 50 MCG/ACT nasal spray, Place 2 sprays into both nostrils daily., Disp: 16 g, Rfl: 0  Observations/Objective: Patient is well-developed, well-nourished in no acute distress.  Resting comfortably at home.  Head is normocephalic, atraumatic.  No labored breathing. Speech is clear and coherent with logical content.  Patient is alert and oriented at baseline.   Assessment and Plan: 1. Viral URI with cough - fluticasone  (FLONASE) 50 MCG/ACT nasal spray; Place 2 sprays into both nostrils daily.  Dispense: 16 g; Refill: 0 - benzonatate (TESSALON) 100 MG capsule; Take 1 capsule (100 mg total) by mouth 3 (three) times daily as needed for cough.  Dispense: 30 capsule; Refill: 0  2. Non-recurrent acute suppurative otitis media of right ear without spontaneous rupture of tympanic membrane - azithromycin (ZITHROMAX) 250 MG tablet; Take 2 tablets on day 1, then 1 tablet daily on days 2 through 5  Dispense: 6 tablet; Refill: 0  Will have her COVID tested to rule out. She is to quarantine until results are in and next steps are determined.  Could be flu versus run-of-the-mill viral infection. She is past 48 hour window so no need for flu testing as antiviral not option. Supportive measures and OTC medications reviewed. Rx Flonase and Tessalon. Concern for superimposed AOM giving ear pain and her substantial history of infection. Rx Azithromycin as she is penicillin-allergic.   Follow Up Instructions: I discussed the assessment and treatment plan with the patient. The patient was provided an opportunity to ask questions and all were answered. The patient agreed with the plan and demonstrated an understanding of the instructions.  A copy of instructions were sent to the patient via MyChart unless otherwise noted below.   The patient was advised to call back or seek an in-person evaluation if the symptoms worsen or if the condition fails to improve as anticipated.  Time:  I spent 12 minutes with the patient via telehealth technology discussing the above problems/concerns.    Piedad Climes, PA-C

## 2021-07-04 IMAGING — CT CT ABD-PELV W/O CM
2 of 4 series · 15 of 46 positions shown, 17 images · non-contrast
Comparison: A pelvic ultrasound same date. Abdominopelvic CT
06/30/2016.

CLINICAL DATA: Right lower quadrant abdominal pain. Appendicitis
suspected. Right flank pain increasing today. Onset of symptoms this
morning. Unable to urinate this morning.

EXAM:
CT ABDOMEN AND PELVIS WITHOUT CONTRAST
TECHNIQUE: Multidetector CT imaging of the abdomen and pelvis was performed
following the standard protocol without IV contrast.

[Series 3: ap without · axial · non-contrast · 0.58mm/px · z∈[-668,-268]mm · 12 of 90 slices shown, 14 images]
[im 5/90  soft-tissue]
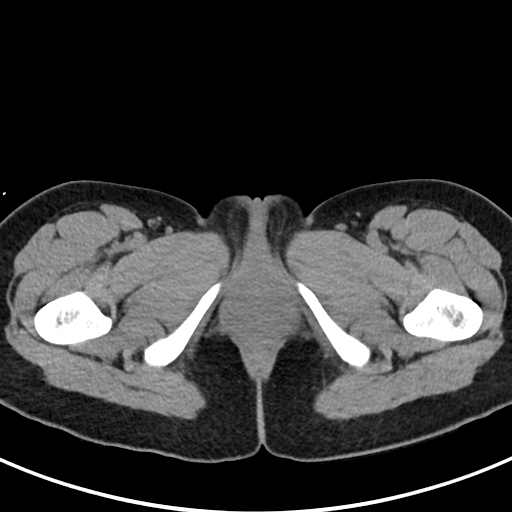
[im 5/90  bone]
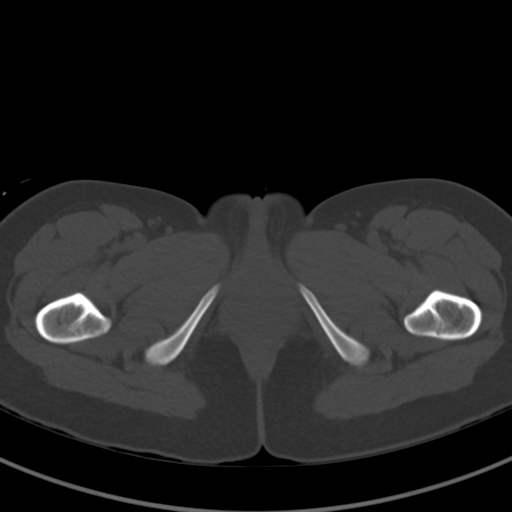
[im 15/90  soft-tissue]
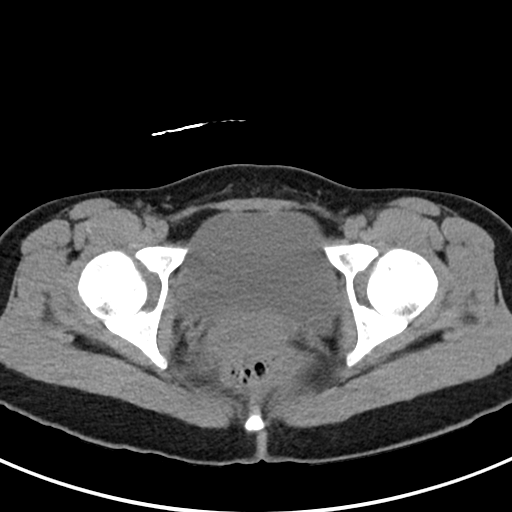
[im 20/90  soft-tissue]
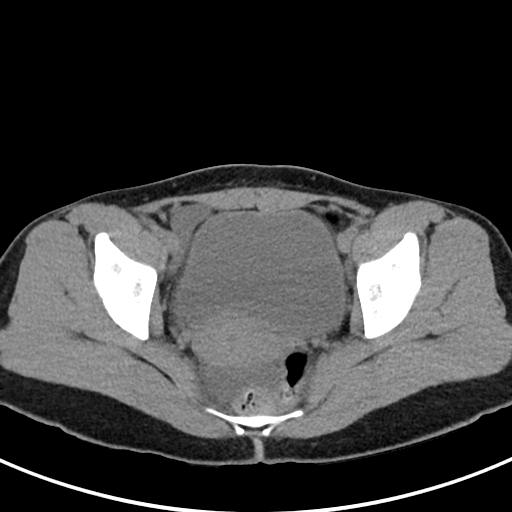
[im 25/90  soft-tissue]
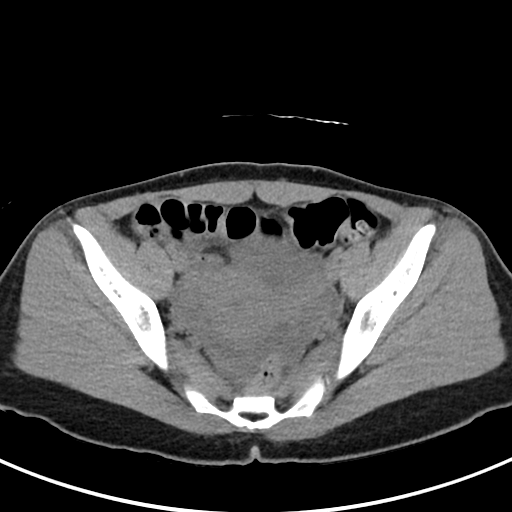
[im 35/90  soft-tissue]
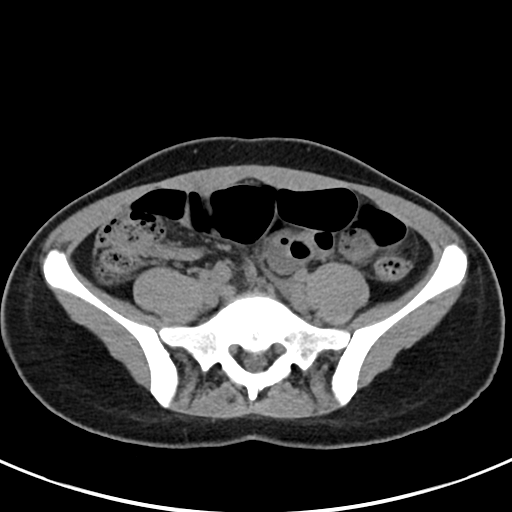
[im 40/90  soft-tissue]
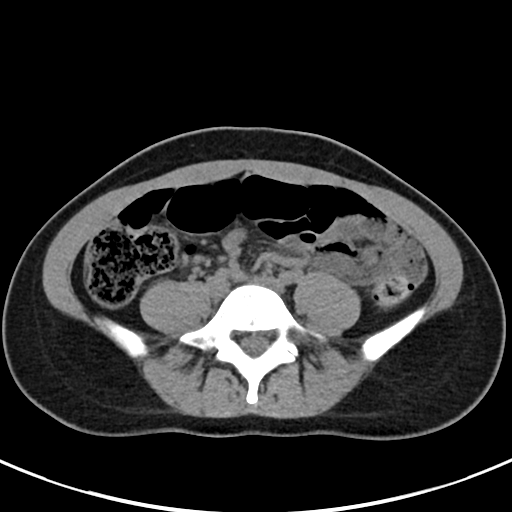
[im 50/90  soft-tissue]
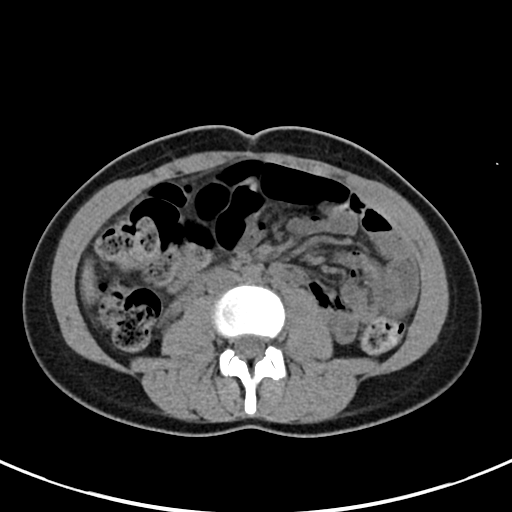
[im 55/90  soft-tissue]
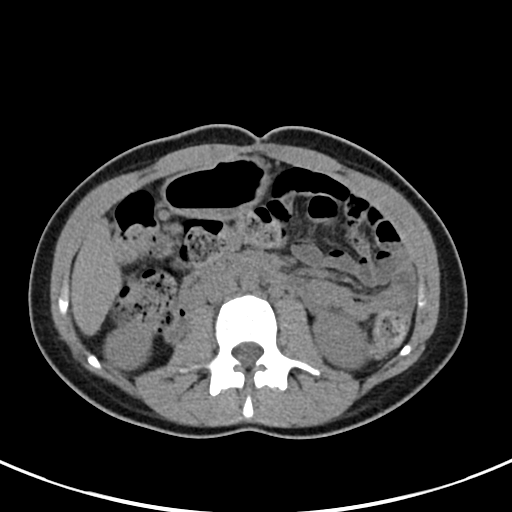
[im 65/90  soft-tissue]
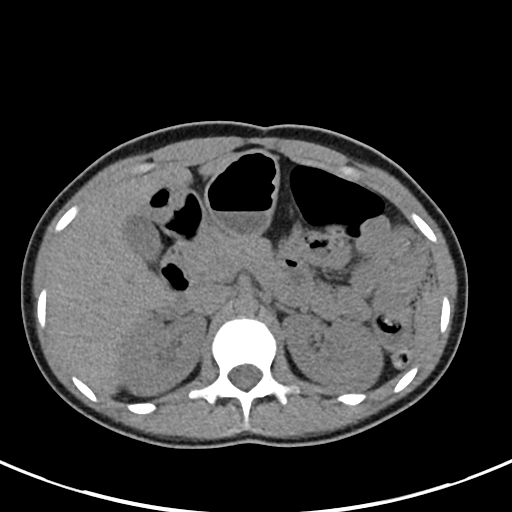
[im 65/90  bone]
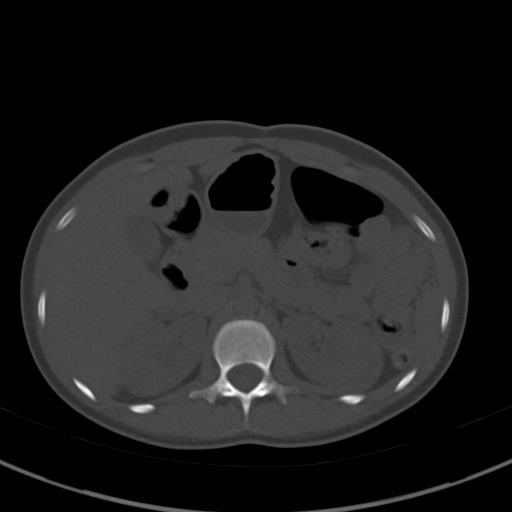
[im 70/90  soft-tissue]
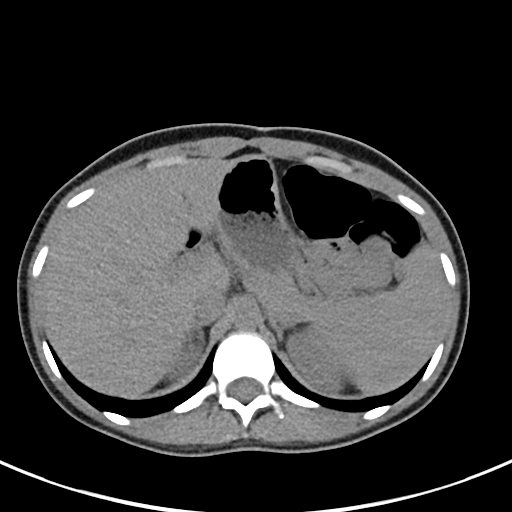
[im 75/90  soft-tissue]
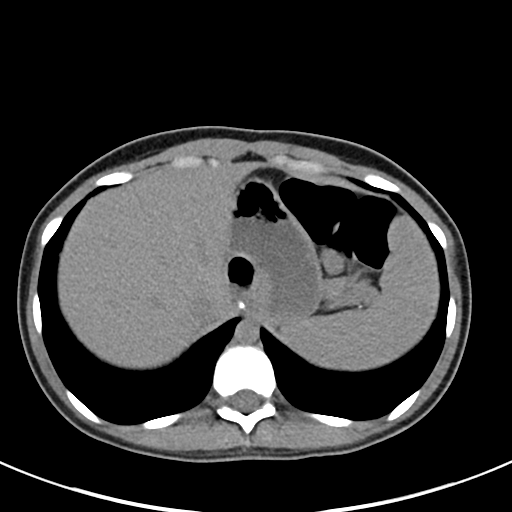
[im 85/90  soft-tissue]
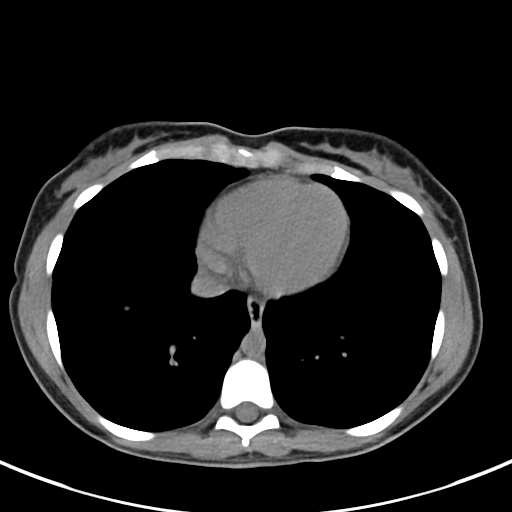

[Series 6: cor · coronal · 0.64mm/px · 3 of 77 slices shown]
[im 26/77  soft-tissue]
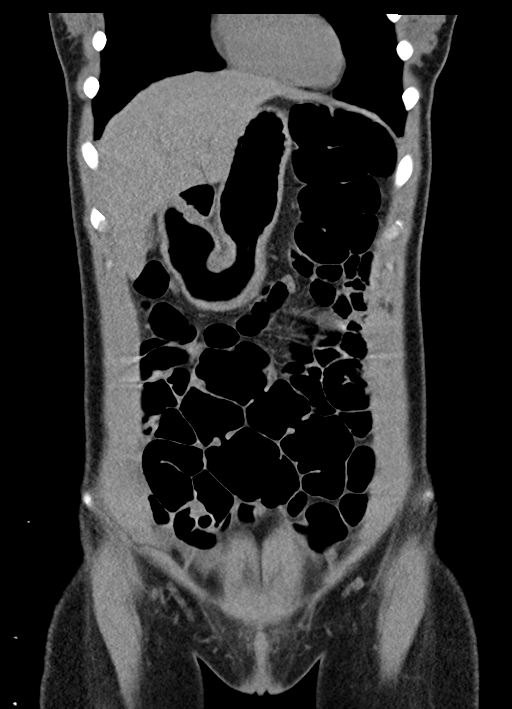
[im 34/77  soft-tissue]
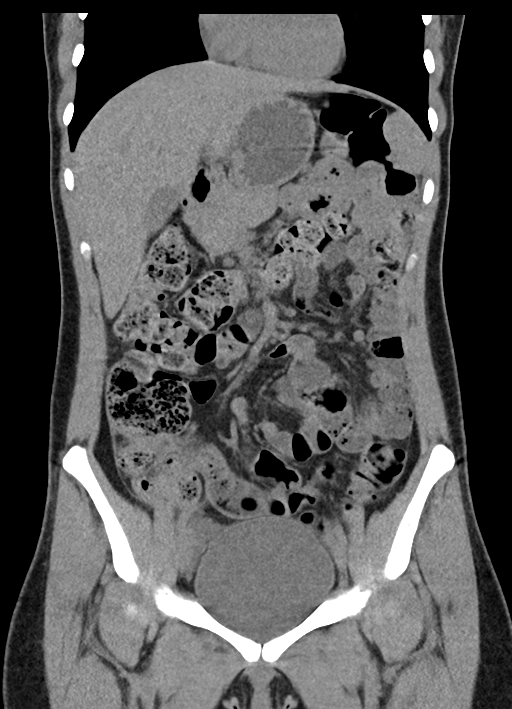
[im 43/77  soft-tissue]
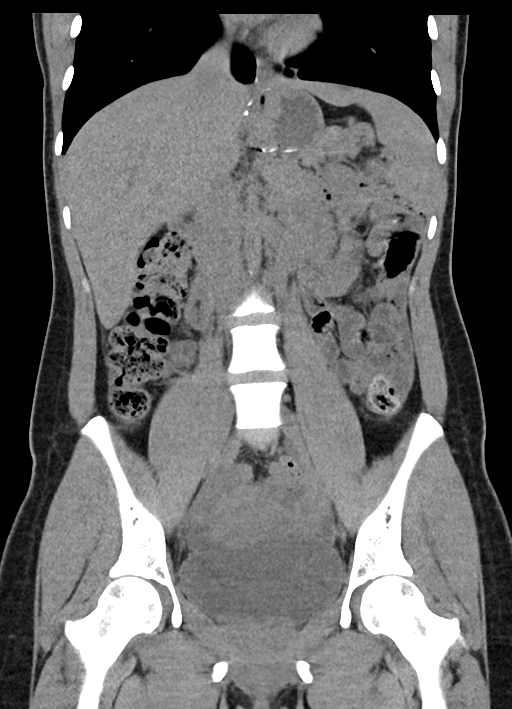

[15 of 46 positions shown; findings below may reference images not displayed]

FINDINGS: Lower chest: Clear lung bases. No significant pleural or pericardial
effusion.

Hepatobiliary: No focal hepatic abnormalities are identified on
noncontrast imaging. No evidence of gallstones, gallbladder wall
thickening or biliary dilatation.

Pancreas: Unremarkable. No pancreatic ductal dilatation or
surrounding inflammatory changes.

Spleen: Normal in size without focal abnormality.

Adrenals/Urinary Tract: Both adrenal glands appear normal. No
evidence of urinary tract calculus, hydronephrosis or perinephric
soft tissue stranding. The bladder is mildly distended without wall
thickening or surrounding inflammation.

Stomach/Bowel: No enteric contrast was administered. There are
stable postsurgical changes at the gastroesophageal junction
consistent with prior Nissen fundoplication. The stomach appears
unremarkable for its degree of distension. No evidence of bowel wall
thickening, distention or surrounding inflammatory change. The
appendix appears normal. There is moderate stool in the right colon.

Vascular/Lymphatic: There are no enlarged abdominal or pelvic lymph
nodes. No significant vascular findings on noncontrast imaging.

Reproductive: The uterus appears unremarkable. The complex right
ovarian lesion seen on earlier ultrasound is not easily
differentiated from adjacent unopacified bowel and pelvic fluid.

Other: Small to moderate amount of free pelvic fluid without
high-density components. No free air or focal extraluminal fluid
collection. The anterior abdominal wall is intact.

Musculoskeletal: No acute or significant osseous findings.
IMPRESSION: 1. Small to moderate amount of free pelvic fluid associated with a
complex right adnexal lesion on preceding ultrasound, suggesting
possible ovarian cyst rupture.
2. The appendix appears normal.
3. No evidence of urinary tract calculus or hydronephrosis.
4. Prior Nissen fundoplication.

## 2021-07-04 IMAGING — US US PELVIS COMPLETE TRANSABD/TRANSVAG W DUPLEX
1 series · 13 of 25 positions shown · non-contrast
Comparison: 07/24/2008

CLINICAL DATA: Pelvic pain

EXAM:
TRANSABDOMINAL AND TRANSVAGINAL ULTRASOUND OF PELVIS
DOPPLER ULTRASOUND OF OVARIES
TECHNIQUE: Both transabdominal and transvaginal ultrasound examinations of the
pelvis were performed. Transabdominal technique was performed for
global imaging of the pelvis including uterus, ovaries, adnexal
regions, and pelvic cul-de-sac.
It was necessary to proceed with endovaginal exam following the
transabdominal exam to visualize the uterus, endometrium, ovaries
and adnexa. Color and duplex Doppler ultrasound was utilized to
evaluate blood flow to the ovaries.

[Series 1: us pelvic complete w transvaginal and torsion righ · 13 of 158 slices shown]
[im 1/158]
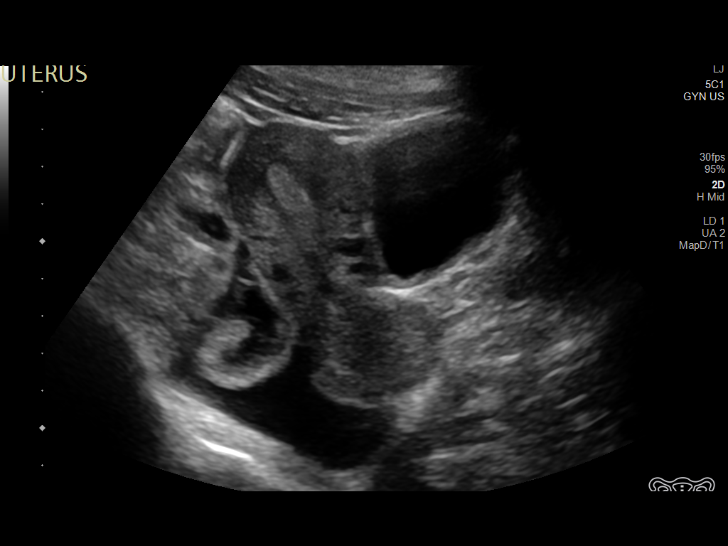
[im 14/158]
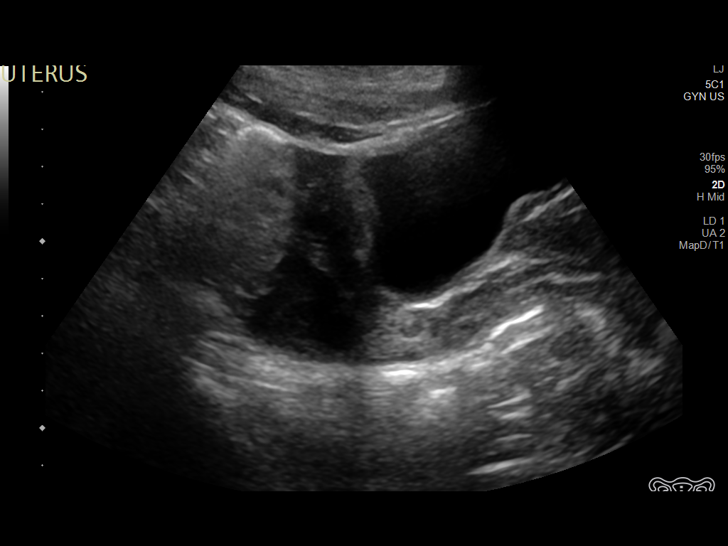
[im 27/158]
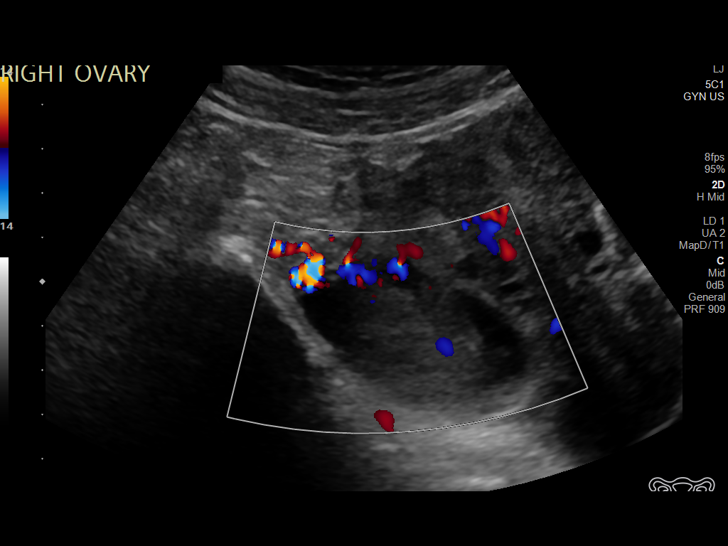
[im 40/158]
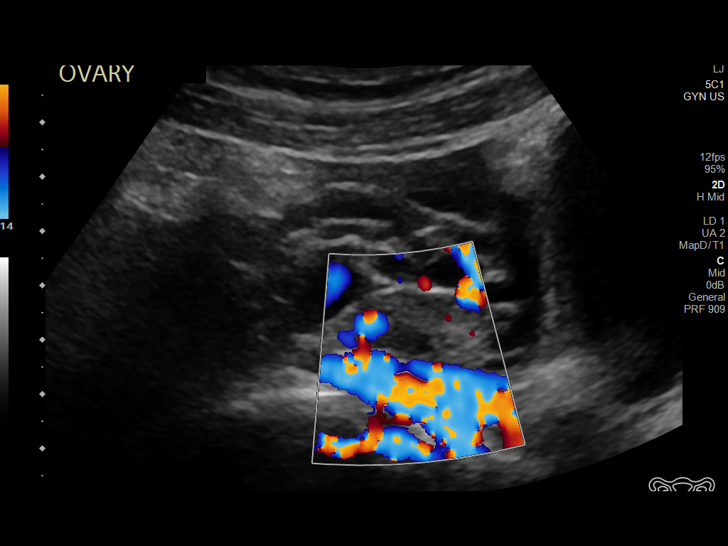
[im 53/158]
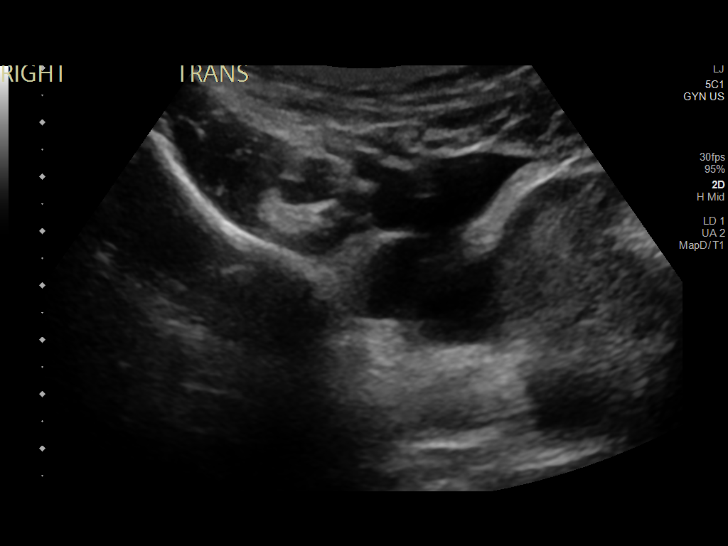
[im 66/158]
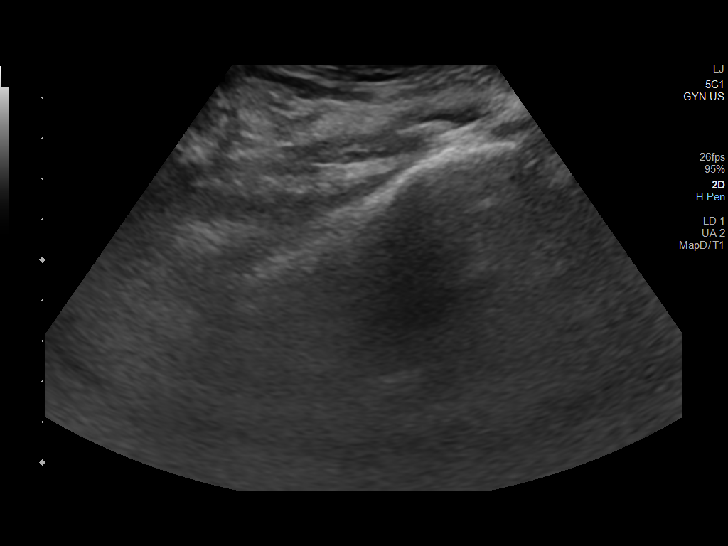
[im 79/158]
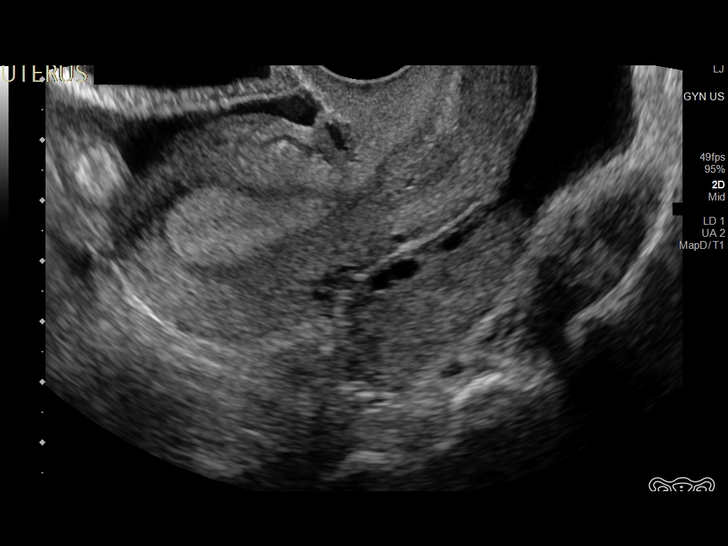
[im 92/158]
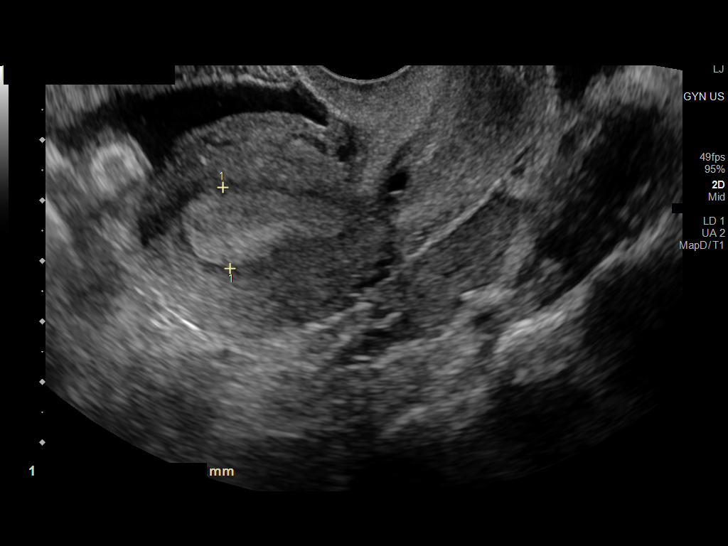
[im 105/158]
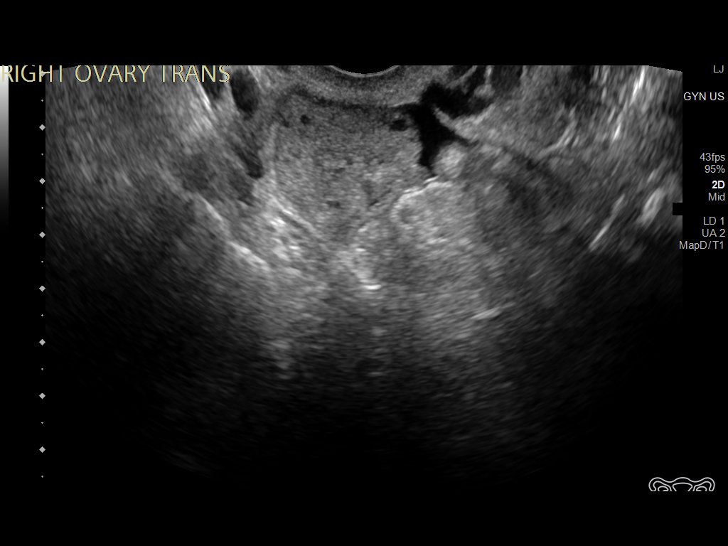
[im 118/158]
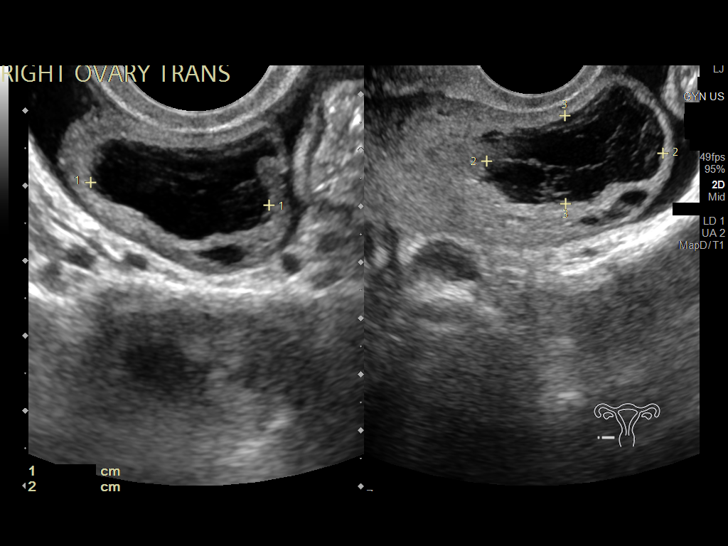
[im 131/158]
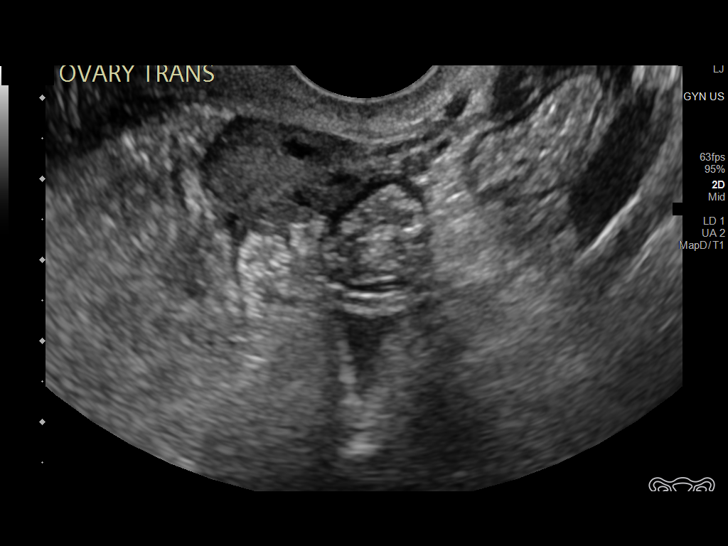
[im 144/158]
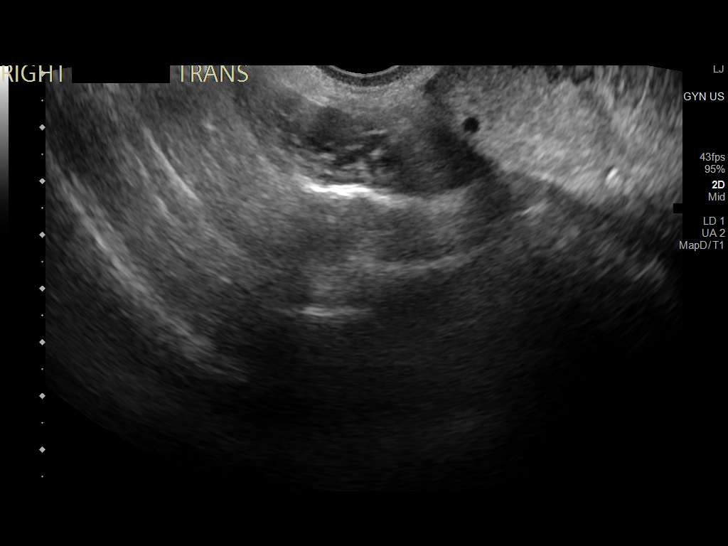
[im 158/158]
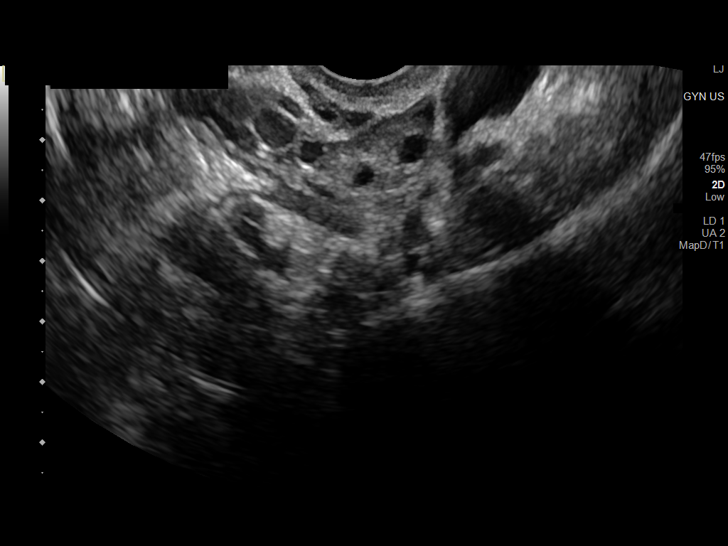

[13 of 25 positions shown; findings below may reference images not displayed]

FINDINGS: Uterus

Measurements: 8.0 x 3.6 x 4.2 cm = volume: 63 mL. No fibroids or
other mass visualized.

Endometrium

Thickness: 13 mm in thickness.  No focal abnormality visualized.

Right ovary

Measurements: 6.9 x 3.0 x 3.3 cm = volume: 35 mL. 3.2 cm cystic area
with septations and low-level internal echoes, likely hemorrhagic
cyst.

Left ovary

Measurements: 3.5 x 1.4 x 2.5 cm = volume: 6.2 mL. Normal
appearance/no adnexal mass.

Pulsed Doppler evaluation of both ovaries demonstrates normal
low-resistance arterial and venous waveforms.

Other findings

Trace free fluid.
IMPRESSION: 3.2 cm complex cyst in the right ovary, likely hemorrhagic cyst. No
evidence of torsion.

## 2021-10-13 ENCOUNTER — Encounter: Payer: Self-pay | Admitting: Family

## 2021-10-13 ENCOUNTER — Ambulatory Visit (INDEPENDENT_AMBULATORY_CARE_PROVIDER_SITE_OTHER): Payer: BC Managed Care – PPO | Admitting: Family

## 2021-10-13 ENCOUNTER — Other Ambulatory Visit: Payer: Self-pay

## 2021-10-13 VITALS — BP 110/70 | HR 95 | Temp 97.1°F | Ht 61.0 in | Wt 106.1 lb

## 2021-10-13 DIAGNOSIS — F418 Other specified anxiety disorders: Secondary | ICD-10-CM

## 2021-10-13 DIAGNOSIS — G43019 Migraine without aura, intractable, without status migrainosus: Secondary | ICD-10-CM | POA: Insufficient documentation

## 2021-10-13 DIAGNOSIS — M2550 Pain in unspecified joint: Secondary | ICD-10-CM | POA: Insufficient documentation

## 2021-10-13 DIAGNOSIS — J4520 Mild intermittent asthma, uncomplicated: Secondary | ICD-10-CM

## 2021-10-13 DIAGNOSIS — E559 Vitamin D deficiency, unspecified: Secondary | ICD-10-CM | POA: Diagnosis not present

## 2021-10-13 DIAGNOSIS — G2581 Restless legs syndrome: Secondary | ICD-10-CM | POA: Diagnosis not present

## 2021-10-13 DIAGNOSIS — S069X3S Unspecified intracranial injury with loss of consciousness of 1 hour to 5 hours 59 minutes, sequela: Secondary | ICD-10-CM

## 2021-10-13 DIAGNOSIS — R413 Other amnesia: Secondary | ICD-10-CM | POA: Diagnosis not present

## 2021-10-13 DIAGNOSIS — G44209 Tension-type headache, unspecified, not intractable: Secondary | ICD-10-CM

## 2021-10-13 DIAGNOSIS — J0110 Acute frontal sinusitis, unspecified: Secondary | ICD-10-CM | POA: Insufficient documentation

## 2021-10-13 DIAGNOSIS — F5101 Primary insomnia: Secondary | ICD-10-CM

## 2021-10-13 DIAGNOSIS — R5382 Chronic fatigue, unspecified: Secondary | ICD-10-CM

## 2021-10-13 DIAGNOSIS — S069XAA Unspecified intracranial injury with loss of consciousness status unknown, initial encounter: Secondary | ICD-10-CM | POA: Insufficient documentation

## 2021-10-13 DIAGNOSIS — J309 Allergic rhinitis, unspecified: Secondary | ICD-10-CM | POA: Insufficient documentation

## 2021-10-13 LAB — CBC WITH DIFFERENTIAL/PLATELET
Basophils Absolute: 0 10*3/uL (ref 0.0–0.1)
Basophils Relative: 0.8 % (ref 0.0–3.0)
Eosinophils Absolute: 0.1 10*3/uL (ref 0.0–0.7)
Eosinophils Relative: 1.2 % (ref 0.0–5.0)
HCT: 38.8 % (ref 36.0–46.0)
Hemoglobin: 13.4 g/dL (ref 12.0–15.0)
Lymphocytes Relative: 20.5 % (ref 12.0–46.0)
Lymphs Abs: 1.3 10*3/uL (ref 0.7–4.0)
MCHC: 34.6 g/dL (ref 30.0–36.0)
MCV: 92.5 fl (ref 78.0–100.0)
Monocytes Absolute: 0.4 10*3/uL (ref 0.1–1.0)
Monocytes Relative: 6.2 % (ref 3.0–12.0)
Neutro Abs: 4.7 10*3/uL (ref 1.4–7.7)
Neutrophils Relative %: 71.3 % (ref 43.0–77.0)
Platelets: 241 10*3/uL (ref 150.0–400.0)
RBC: 4.2 Mil/uL (ref 3.87–5.11)
RDW: 12.7 % (ref 11.5–15.5)
WBC: 6.5 10*3/uL (ref 4.0–10.5)

## 2021-10-13 LAB — TSH: TSH: 1.67 u[IU]/mL (ref 0.35–5.50)

## 2021-10-13 LAB — VITAMIN D 25 HYDROXY (VIT D DEFICIENCY, FRACTURES): VITD: 36.48 ng/mL (ref 30.00–100.00)

## 2021-10-13 LAB — SEDIMENTATION RATE: Sed Rate: 10 mm/hr (ref 0–20)

## 2021-10-13 LAB — C-REACTIVE PROTEIN: CRP: <1 mg/dL (ref 0.5–20.0)

## 2021-10-13 LAB — B12 AND FOLATE PANEL
Folate: 9.9 ng/mL (ref >5.9)
Vitamin B-12: 585 pg/mL (ref 211–911)

## 2021-10-13 MED ORDER — DOXYCYCLINE HYCLATE 100 MG PO TABS: 100.0000 mg | ORAL_TABLET | Freq: Two times a day (BID) | ORAL | 0 refills | Status: AC

## 2021-10-13 MED ORDER — BUTALBITAL-APAP-CAFFEINE 50-325-40 MG PO TABS: 1.0000 | ORAL_TABLET | Freq: Four times a day (QID) | ORAL | 0 refills | Status: AC | PRN

## 2021-10-13 MED ORDER — ALBUTEROL SULFATE HFA 108 (90 BASE) MCG/ACT IN AERS: 2.0000 | INHALATION_SPRAY | Freq: Four times a day (QID) | RESPIRATORY_TRACT | 0 refills | Status: AC | PRN

## 2021-10-13 NOTE — Assessment & Plan Note (Signed)
Migraines/tension headaches, chronic memory concerns, and RLS, referring to neurology.

## 2021-10-13 NOTE — Assessment & Plan Note (Signed)
Ana rf crp sed rate ordered pending results, r/o autoimmune

## 2021-10-13 NOTE — Assessment & Plan Note (Signed)
b12 tsh ordered pending results. Sleep hygiene briefly discussed. Also pt with PTSD from childhood/anxiety which may contribute. Recommended psychology referral, pt will hold for now but is considering this in the near future.
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
## 2021-10-13 NOTE — Assessment & Plan Note (Signed)
Chronic previous history of trying gabapentin with no relief.  Referral to neurologist at this point as other neuro concerns.  Also ordering B12 and thyroid to see if any contribution.

## 2021-10-13 NOTE — Patient Instructions (Addendum)
Recommend you schedule an eye exam as you are overdue.   Abrupter medication given for suspected tension headache, take as needed.   A referral was placed today for neurology.  Please let us know if you have not heard back within 1 week about your referral.  Stop by the lab prior to leaving today. I will notify you of your results once received.   It was a pleasure seeing you today! Please do not hesitate to reach out with any questions and or concerns.  Regards,   Eugenia Pancoast FNP-C

## 2021-10-13 NOTE — Assessment & Plan Note (Signed)
Recommend Flonase daily, continue with claritin.

## 2021-10-13 NOTE — Assessment & Plan Note (Signed)
Refill sent for albuterol

## 2021-10-13 NOTE — Assessment & Plan Note (Deleted)
Migraine vs tension, at this time suspect this is also aggravated triggered by sinusitis. Treated sinusitis, see if resolves. However with h/o migraines, referral to neurologist.

## 2021-10-13 NOTE — Assessment & Plan Note (Signed)
Vitamin d ordered pending results ? ?

## 2021-10-13 NOTE — Assessment & Plan Note (Signed)
Lab workup pending results 

## 2021-10-13 NOTE — Assessment & Plan Note (Signed)
Prescription given for doxycycline 100 mg po bid for ten days. Pt to continue tylenol/ibuprofen prn sinus pain. Continue with humidifier prn and steam showers recommended as well. instructed If no symptom improvement in 48 hours please f/u ? ?

## 2021-10-13 NOTE — Assessment & Plan Note (Addendum)
Trial fioricet, prn.  decrease stress when able.  Increase water intake. Also to treat sinusitis as this may be contributing. Referral to neurology in place as well as chronic.

## 2021-10-18 LAB — TIER 1
Chromatin (Nucleosomal) Antibody: <1 AI
ENA SM Ab Ser-aCnc: <1 AI
Ribonucleic Protein(ENA) Antibody, IgG: <1 AI
SM/RNP: <1 AI
ds DNA Ab: <1 IU/mL

## 2021-10-18 LAB — ANA SCREEN,IFA,REFLEX TITER/PATTERN,REFLEX MPLX 11 AB CASCADE
14-3-3 eta Protein: <0.2 ng/mL (ref <0.2)
Anti Nuclear Antibody (ANA): POSITIVE — AB
Cyclic Citrullin Peptide Ab: <16 UNITS
Rheumatoid fact SerPl-aCnc: <14 IU/mL (ref <14)

## 2021-10-18 LAB — TIER 2
Jo-1 Autoabs: <1 AI
SSA (Ro) (ENA) Antibody, IgG: <1 AI
SSB (La) (ENA) Antibody, IgG: <1 AI
Scleroderma (Scl-70) (ENA) Antibody, IgG: <1 AI

## 2021-10-18 LAB — TIER 3
Centromere Ab Screen: <1 AI
Ribosomal P Protein Ab: <1 AI

## 2021-10-18 LAB — ANTI-NUCLEAR AB-TITER (ANA TITER): ANA Titer 1: 1:40 {titer} — ABNORMAL HIGH

## 2021-10-18 LAB — INTERPRETATION

## 2021-10-19 ENCOUNTER — Other Ambulatory Visit: Payer: Self-pay | Admitting: Family

## 2021-10-19 DIAGNOSIS — R768 Other specified abnormal immunological findings in serum: Secondary | ICD-10-CM

## 2021-10-19 NOTE — Progress Notes (Signed)
Let pt know I have decided to refer her to rheumatology for low positive ANA for workup. Let her know if she does not hear anything back about this referral in about 1 week to please let us know

## 2021-11-23 ENCOUNTER — Encounter: Payer: Self-pay | Admitting: Family

## 2021-11-25 NOTE — Telephone Encounter (Signed)
Patient called office follow up virtual made and instructions given on what to expect for setting up for appointment.  ?

## 2021-11-26 ENCOUNTER — Encounter: Payer: Self-pay | Admitting: Family

## 2021-11-26 ENCOUNTER — Other Ambulatory Visit: Payer: Self-pay

## 2021-11-26 ENCOUNTER — Telehealth (INDEPENDENT_AMBULATORY_CARE_PROVIDER_SITE_OTHER): Payer: BC Managed Care – PPO | Admitting: Family

## 2021-11-26 VITALS — Ht 61.0 in | Wt 107.0 lb

## 2021-11-26 DIAGNOSIS — J309 Allergic rhinitis, unspecified: Secondary | ICD-10-CM

## 2021-11-26 DIAGNOSIS — S069X3S Unspecified intracranial injury with loss of consciousness of 1 hour to 5 hours 59 minutes, sequela: Secondary | ICD-10-CM

## 2021-11-26 DIAGNOSIS — F418 Other specified anxiety disorders: Secondary | ICD-10-CM | POA: Diagnosis not present

## 2021-11-26 DIAGNOSIS — R768 Other specified abnormal immunological findings in serum: Secondary | ICD-10-CM | POA: Insufficient documentation

## 2021-11-26 DIAGNOSIS — G44209 Tension-type headache, unspecified, not intractable: Secondary | ICD-10-CM

## 2021-11-26 MED ORDER — BUSPIRONE HCL 7.5 MG PO TABS: 7.5000 mg | ORAL_TABLET | Freq: Two times a day (BID) | ORAL | 2 refills | Status: AC
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
## 2021-11-29 NOTE — Assessment & Plan Note (Signed)
Continue daily flonase and allergy medication/zyrtec ?

## 2021-11-29 NOTE — Assessment & Plan Note (Signed)
tiral buspirone which may help with both anxiety and insomina.  ?D/w pt proper sleep hygiene ?May need to consider psychiatry if no improvement ?Unable to try trazodone due to h/o seizures ?Can not use elavil due to h/o intolerances  ?Ssri also with poor s/e in the past (prozac) ?

## 2021-11-29 NOTE — Assessment & Plan Note (Signed)
fioricet prn ?Referral to neuro already in place, pt to make appt ?Avoid triggers as able ?

## 2021-11-29 NOTE — Assessment & Plan Note (Signed)
Will repeat this next visit  ?

## 2021-11-29 NOTE — Assessment & Plan Note (Signed)
Again advised pt to make f/u appt with neurologist ?Will defer MRI for now as seizures with no recent episodes.  ?Headaches improving ?
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
## 2021-12-17 ENCOUNTER — Encounter: Payer: Self-pay | Admitting: Internal Medicine

## 2021-12-17 ENCOUNTER — Ambulatory Visit: Payer: BC Managed Care – PPO | Admitting: Internal Medicine

## 2021-12-17 VITALS — BP 119/84 | HR 103 | Resp 14 | Ht 60.0 in | Wt 103.0 lb

## 2021-12-17 DIAGNOSIS — R768 Other specified abnormal immunological findings in serum: Secondary | ICD-10-CM | POA: Diagnosis not present

## 2021-12-17 DIAGNOSIS — M2550 Pain in unspecified joint: Secondary | ICD-10-CM

## 2021-12-17 DIAGNOSIS — R5382 Chronic fatigue, unspecified: Secondary | ICD-10-CM

## 2022-01-19 ENCOUNTER — Telehealth: Payer: BC Managed Care – PPO | Admitting: Physician Assistant

## 2022-01-19 DIAGNOSIS — Z91199 Patient's noncompliance with other medical treatment and regimen due to unspecified reason: Secondary | ICD-10-CM

## 2022-01-19 NOTE — Progress Notes (Signed)
The patient no-showed for appointment despite this provider sending direct link, reaching out via phone with no response and waiting for at least 10 minutes from appointment time for patient to join. They will be marked as a NS for this appointment/time.  ? ?[place] Cody Hazaiah Edgecombe, PA-C ? ? ? ?

## 2022-05-04 ENCOUNTER — Telehealth: Payer: Self-pay | Admitting: Nurse Practitioner

## 2022-05-04 DIAGNOSIS — J069 Acute upper respiratory infection, unspecified: Secondary | ICD-10-CM

## 2022-05-04 NOTE — Progress Notes (Signed)
Virtual Visit Consent   Jenny Cain, you are scheduled for a virtual visit with a [facility] provider today. Just as with appointments in the office, your consent must be obtained to participate. Your consent will be active for this visit and any virtual visit you may have with one of our providers in the next 365 days. If you have a MyChart account, a copy of this consent can be sent to you electronically.  As this is a virtual visit, video technology does not allow for your provider to perform a traditional examination. This may limit your provider's ability to fully assess your condition. If your provider identifies any concerns that need to be evaluated in person or the need to arrange testing (such as labs, EKG, etc.), we will make arrangements to do so. Although advances in technology are sophisticated, we cannot ensure that it will always work on either your end or our end. If the connection with a video visit is poor, the visit may have to be switched to a telephone visit. With either a video or telephone visit, we are not always able to ensure that we have a secure connection.  By engaging in this virtual visit, you consent to the provision of healthcare and authorize for your insurance to be billed (if applicable) for the services provided during this visit. Depending on your insurance coverage, you may receive a charge related to this service.  I need to obtain your verbal consent now. Are you willing to proceed with your visit today? Jenny Cain has provided verbal consent on 05/04/2022 for a virtual visit (video or telephone). Jenny Simas, FNP  Date: 05/04/2022 8:13 AM  Virtual Visit via Video Note   I, Jenny Cain, [name]  ([id number], [birth year]) on 05/04/22 at  8:15 AM EDT by a video-enabled telemedicine application and verified that I am speaking with the correct person using two identifiers.  Location: Patient: Virtual Visit Location  Patient: Home Provider: Virtual Visit Location Provider: Home Office   I discussed the limitations of evaluation and management by telemedicine and the availability of in person appointments. The patient expressed understanding and agreed to proceed.    History of Present Illness: Jenny Cain is a 26 y.o. who identifies as a female who was assigned female at birth, and is being seen today with complaints of low grade fever yesterday. She woke up feeling worse today with nasal congestion a cough and sweats this morning.   She took Dayquil for relief this morning  She was exposed to someone known to have bronchitis  She does have asthma and uses her albuterol inhaler as needed only and is well controlled   She has not taken a COVID test  Problems:  Patient Active Problem List   Diagnosis Date Noted   Positive ANA (antinuclear antibody) 12/17/2021   Positive Lyme disease serology 11/26/2021   TBI (traumatic brain injury) ([facility]) 10/13/2021   Restless legs syndrome 10/13/2021   Vitamin D deficiency 10/13/2021   Polyarthralgia 10/13/2021   Chronic fatigue 10/13/2021   Allergic rhinitis 10/13/2021   Acute non intractable tension-type headache 10/13/2021   Asthma, well controlled 03/26/2019   Mixed anxiety and depressive disorder 02/12/2013   Insomnia 02/12/2013    Allergies:  Allergies  Allergen Reactions   Amoxicillin Swelling   Cefprozil    Elavil [Amitriptyline] Other (See Comments)    Made her feel faint like she was going to pass out   Penicillins  Fluoxetine Anxiety   Medications:  Current Outpatient Medications:    albuterol (VENTOLIN HFA) 108 (90 Base) MCG/ACT inhaler, Inhale 2 puffs into the lungs every 6 (six) hours as needed for wheezing or shortness of breath., Disp: 8 g, Rfl: 0   butalbital-acetaminophen-caffeine (FIORICET) 50-325-40 MG tablet, Take 1 tablet by mouth every 6 (six) hours as needed for headache. Do not exceed 6 per days (Patient not taking:  Reported on 12/17/2021), Disp: 14 tablet, Rfl: 0   fluticasone (FLONASE) 50 MCG/ACT nasal spray, Place 2 sprays into both nostrils daily. (Patient taking differently: Place 2 sprays into both nostrils as needed.), Disp: 16 g, Rfl: 0  Observations/Objective: Patient is well-developed, well-nourished in no acute distress.  Resting comfortably  at home.  Head is normocephalic, atraumatic.  No labored breathing.  Speech is clear and coherent with logical content.  Patient is alert and oriented at baseline.    Assessment and Plan: 1. Viral upper respiratory tract infection Advised using over the counter medications for cough and congestion (Mucinex) per package instructions.  Push fluids and rest Alternate tylenol and advil for fever and body aches   Advised COVID testing and RTC work if COVID negative and fever free for 24 hours Otherwise follow CDC guidelines if positive for COVID with 5 days of isolation      Follow Up Instructions: I discussed the assessment and treatment plan with the patient. The patient was provided an opportunity to ask questions and all were answered. The patient agreed with the plan and demonstrated an understanding of the instructions.  A copy of instructions were sent to the patient via MyChart unless otherwise noted below.    The patient was advised to call back or seek an in-person evaluation if the symptoms worsen or if the condition fails to improve as anticipated.  Time:  I spent 10 minutes with the patient via telehealth technology discussing the above problems/concerns.    Jenny Simas, FNP

## 2022-10-20 ENCOUNTER — Ambulatory Visit: Payer: 59 | Admitting: Family Medicine

## 2022-10-20 ENCOUNTER — Encounter: Payer: Self-pay | Admitting: Family Medicine

## 2022-10-20 VITALS — BP 130/70 | HR 120 | Temp 100.7°F | Ht 61.0 in | Wt 105.2 lb

## 2022-10-20 DIAGNOSIS — R509 Fever, unspecified: Secondary | ICD-10-CM

## 2022-10-20 DIAGNOSIS — R52 Pain, unspecified: Secondary | ICD-10-CM | POA: Diagnosis not present

## 2022-10-20 DIAGNOSIS — J020 Streptococcal pharyngitis: Secondary | ICD-10-CM

## 2022-10-20 DIAGNOSIS — J029 Acute pharyngitis, unspecified: Secondary | ICD-10-CM | POA: Diagnosis not present

## 2022-10-20 LAB — POC INFLUENZA A&B (BINAX/QUICKVUE)
Influenza A, POC: NEGATIVE
Influenza B, POC: NEGATIVE

## 2022-10-20 LAB — POCT RAPID STREP A (OFFICE): Rapid Strep A Screen: POSITIVE — AB

## 2022-10-20 LAB — POC COVID19 BINAXNOW: SARS Coronavirus 2 Ag: NEGATIVE

## 2022-10-20 MED ORDER — AZITHROMYCIN 250 MG PO TABS: ORAL_TABLET | ORAL | 0 refills | Status: AC

## 2022-10-20 NOTE — Progress Notes (Signed)
Jenny Blyth T. Bonny Egger, MD, [place] at [facility] [place], [address]  Phone: [phone]  FAX: [phone]  Jenny Cain - 27 y.o. female  MRN [id number]  Date of Birth: 1996/05/06  Date: 10/20/2022  PCP: Eugenia Pancoast, FNP  Referral: Eugenia Pancoast, FNP  Chief Complaint  Patient presents with   Headache   Generalized Body Aches        Sore Throat   Subjective:   Jenny Cain is a 27 y.o. very pleasant female patient with Body mass index is 19.89 kg/m. who presents with the following:  27 year old acutely ill patient:   3 AM woke up early and had a severe headache, and could not really feel good.  Not coughing, but her throat is hurting.  Body aches and sore throat are really bad.  She generally feels terrible.  Diffuse body aches, these are the worst part along with a headache and sore throat.  Does have a history of mild asthma with only rare albuterol use.  Vapes.  She is not wheezing or having any trouble difficulty breathing right now.  No nausea, vomiting, diarrhea.  Review of Systems is noted in the HPI, as appropriate  Objective:   BP 130/70   Pulse (!) 120   Temp (!) 100.7 F (38.2 C) (Oral)   Ht 5' 1"$  (1.549 m)   Wt 105 lb 4 oz (47.7 kg)   LMP  (LMP Unknown)   SpO2 99%   BMI 19.89 kg/m    Gen: WDWN, cooperative. Globally Non-toxic HEENT: Normocephalic and atraumatic. Throat clear, w/o exudate, R TM clear, L TM - good landmarks, No fluid present. rhinnorhea. No frontal or maxillary sinus T. MMM NECK: Anterior cervical  LAD is present CV: RRR, No M/G/R, cap refill <2 sec PULM: Breathing comfortably in no respiratory distress. no wheezing, crackles, rhonchi ABD: S,NT,ND,+BS. No HSM. No rebound. MSK: Nml gait   Laboratory and Imaging Data: Results for orders placed or performed in visit on 10/20/22  POC COVID-19  Result Value Ref Range   SARS Coronavirus 2 Ag Negative  Negative  POCT rapid strep A  Result Value Ref Range   Rapid Strep A Screen Positive (A) Negative  POC Influenza A&B (Binax test)  Result Value Ref Range   Influenza A, POC Negative Negative   Influenza B, POC Negative Negative     Assessment and Plan:     ICD-10-CM   1. Strep throat  J02.0     2. Body aches  R52 POC COVID-19    POC Influenza A&B (Binax test)    3. Fever, unspecified fever cause  R50.9 POC COVID-19    POC Influenza A&B (Binax test)    4. Sore throat  J02.9 POCT rapid strep A     Acute strep throat, penicillin allergic.  Treat as such.  Medication Management during today's office visit: Meds ordered this encounter  Medications   azithromycin (ZITHROMAX) 250 MG tablet    Sig: Take 2 tablets (500 mg total) by mouth daily for 1 day, THEN 1 tablet (250 mg total) daily for 4 days.    Dispense:  6 tablet    Refill:  0   Medications Discontinued During This Encounter  Medication Reason   fluticasone (FLONASE) 50 MCG/ACT nasal spray Duplicate    Orders placed today for conditions managed today: Orders Placed This Encounter  Procedures   POC COVID-19   POCT rapid strep  A   POC Influenza A&B (Binax test)    Disposition: No follow-ups on file.  [employer] speech-to-text software was used for transcription in this dictation.  Possible transcriptional errors can occur using Editor, commissioning.   Signed,  Maud Deed. Jese Comella, MD   Outpatient Encounter Medications as of 10/20/2022  Medication Sig   albuterol (VENTOLIN HFA) 108 (90 Base) MCG/ACT inhaler Inhale 2 puffs into the lungs every 6 (six) hours as needed for wheezing or shortness of breath.   azithromycin (ZITHROMAX) 250 MG tablet Take 2 tablets (500 mg total) by mouth daily for 1 day, THEN 1 tablet (250 mg total) daily for 4 days.   fluticasone (FLONASE) 50 MCG/ACT nasal spray Place 1 spray into both nostrils daily as needed for allergies or rhinitis.   butalbital-acetaminophen-caffeine  (FIORICET) 50-325-40 MG tablet Take 1 tablet by mouth every 6 (six) hours as needed for headache. Do not exceed 6 per days (Patient not taking: Reported on 12/17/2021)   [DISCONTINUED] fluticasone (FLONASE) 50 MCG/ACT nasal spray Place 2 sprays into both nostrils daily. (Patient taking differently: Place 2 sprays into both nostrils as needed.)   No facility-administered encounter medications on file as of 10/20/2022.

## 2022-11-03 ENCOUNTER — Telehealth: Payer: Self-pay

## 2022-11-03 NOTE — Telephone Encounter (Signed)
Patient is on the schedule for tomorrow to be seen.

## 2022-11-03 NOTE — Telephone Encounter (Signed)
Jenny Cain - Client TELEPHONE ADVICE RECORD [facility] Patient Name: Jenny Cain DDY Gender: Female DOB: 05-13-1996 Age: 27 Y 9 M 29 D Return Phone Number: [phone] (Primary) Address: City/ State/ Zip: [place]  [address] Client [facility] Cain - Client Client Site [place] Provider AA - PHYSICIAN, Verita Schneiders- MD Contact Type Call Who Is Calling Patient / Member / Family / Caregiver Call Type Triage / Clinical Relationship To Patient Self Return Phone Number [phone] (Primary) Chief Complaint Sore Throat Reason for Call Symptomatic / Request for Health Information Initial Comment Caller states shes still having issues with a sore throat and has coughing now with mucus. Caller states she would like to see if she is able to get more medication fro her strap throat. Translation No Nurse Assessment Nurse: Jenny Resides, RN, Diane Date/Time Jenny Cain Time): 11/03/2022 9:48:22 AM Confirm and document reason for call. If symptomatic, describe symptoms. ---Caller states she was on Azithromycin last week for strep. Still has a sore throat and also has a cough. Sore throat got better and then the cough started and sore throat came back. Also has swollen lymph nodes. No difficulty breathing or swallowing. Does the patient have any new or worsening symptoms? ---Yes Will a triage be completed? ---Yes Related visit to physician within the last 2 weeks? ---Yes Does the PT have any chronic conditions? (i.e. diabetes, asthma, this includes High risk factors for pregnancy, etc.) ---Yes List chronic conditions. ---asthma Is the patient pregnant or possibly pregnant? (Ask all females between the ages of 3-55) ---No Is this a behavioral health or substance abuse call? ---No Guidelines Guideline Title Affirmed Question Affirmed Notes Nurse Date/Time (Eastern Time) Cough - Acute Productive Chest  pain (Exception: MILD central chest pain, present only when coughing.) Jenny Resides, RN, Diane 11/03/2022 9:50:29 AM PLEASE NOTE: All timestamps contained within this report are represented as [place] Standard Time. CONFIDENTIALTY NOTICE: This fax transmission is intended only for the addressee. It contains information that is legally privileged, confidential or otherwise protected from use or disclosure. If you are not the intended recipient, you are strictly prohibited from reviewing, disclosing, copying using or disseminating any of this information or taking any action in reliance on or regarding this information. If you have received this fax in error, please notify us immediately by telephone so that we can arrange for its return to [place]. Phone: [phone], Toll-Free: [phone], Fax: [phone] Page: 2 of 2 Call Id: [id number] [place]. Time Jenny Cain Time) Disposition Final User 11/03/2022 9:02:10 AM Attempt made - message left Carmon, RN, Jenny Cain 11/03/2022 9:09:05 AM Attempt made - message left Carmon, RN, Jenny Cain 11/03/2022 9:28:32 AM FINAL ATTEMPT MADE - message left Thad Ranger, RN, Jenny Cain 11/03/2022 9:28:57 AM Send to RN Final Attempt Christin Bach, RN, Jenny Cain 11/03/2022 9:53:09 AM Go to ED Now Yes Jenny Resides, RN, Diane Final Disposition 11/03/2022 9:53:09 AM Go to ED Now Yes Jenny Resides, RN, Diane Caller Disagree/Comply Disagree Caller Understands Yes PreDisposition Did not know what to do Care Advice Given Per Guideline GO TO ED NOW: * You need to be seen in the Emergency Department. ANOTHER ADULT SHOULD DRIVE: * It is better and safer if another adult drives instead of you. CARE ADVICE given per Cough - Acute Productive (Adult) guideline. CALL EMS 911 IF: * Severe difficulty breathing occurs * Lips or face turns blue Comments User: Hildred Priest, RN Date/Time (Eastern Time): 11/03/2022 9:53:57 AM Pt. states cannot go to ER today. She is  going to call back for appt. tomorrow. Referrals GO TO FACILITY  REFUSE

## 2022-11-03 NOTE — Telephone Encounter (Signed)
I spoke with pt; pt said she cannot go to ED and wants to schedule appt on 11/04/22. Pt said when finished abx for strep her throat was a lot better but never completely cleared. And now pt has scratchy throat and dry cough and burning sensation in chest. Pt said no CP, SOB and no fever. Pt said she has sounds of mucus in her chest but will not call it wheezing. Pt said no swelling at mouth, tongue, throat or neck. No difficulty in breathing. Pt scheduled appt with Eugenia Pancoast FNP on 11/04/22 at 11 AM with UC & ED precautions and pt voiced understanding. Sending note to Red Christians FNP and [place] pool.

## 2022-11-04 ENCOUNTER — Ambulatory Visit: Payer: 59 | Admitting: Family

## 2022-11-04 VITALS — BP 120/80 | HR 100 | Temp 102.1°F | Ht 61.0 in | Wt 104.2 lb

## 2022-11-04 DIAGNOSIS — J101 Influenza due to other identified influenza virus with other respiratory manifestations: Secondary | ICD-10-CM

## 2022-11-04 DIAGNOSIS — Z20822 Contact with and (suspected) exposure to covid-19: Secondary | ICD-10-CM | POA: Diagnosis not present

## 2022-11-04 DIAGNOSIS — R509 Fever, unspecified: Secondary | ICD-10-CM

## 2022-11-04 LAB — POCT INFLUENZA A/B
Influenza A, POC: NEGATIVE
Influenza B, POC: POSITIVE — AB

## 2022-11-04 LAB — POC COVID19 BINAXNOW: SARS Coronavirus 2 Ag: NEGATIVE

## 2022-11-04 MED ORDER — OSELTAMIVIR PHOSPHATE 75 MG PO CAPS: 75.0000 mg | ORAL_CAPSULE | Freq: Two times a day (BID) | ORAL | 0 refills | Status: AC

## 2022-11-04 NOTE — Telephone Encounter (Signed)
Great thank you Will see her then.

## 2022-11-04 NOTE — Progress Notes (Signed)
Established Patient Office Visit  Subjective:   Patient ID: Jenny Cain, female    DOB: [birth year]  Age: 27 y.o. MRN: [id number]  CC:  Chief Complaint  Patient presents with   Sore Throat    HPI: Jenny Cain is a 27 y.o. female presenting on 11/04/2022 for Sore Throat   Sore Throat     Two weeks ago diagnosed with strep, and was treated with zpack.  Felt better for maybe three days, but then sore throat came back.   Two days ago started with headaches, sweating/chills that started yesterday, and with sinus pressure and nasal congestion. Cough started yesterday non productive. With runny nose.            ROS: Negative unless specifically indicated above in HPI.   Relevant past medical history reviewed and updated as indicated.   Allergies and medications reviewed and updated.   Current Outpatient Medications:    albuterol (VENTOLIN HFA) 108 (90 Base) MCG/ACT inhaler, Inhale 2 puffs into the lungs every 6 (six) hours as needed for wheezing or shortness of breath., Disp: 8 g, Rfl: 0   butalbital-acetaminophen-caffeine (FIORICET) 50-325-40 MG tablet, Take 1 tablet by mouth every 6 (six) hours as needed for headache. Do not exceed 6 per days, Disp: 14 tablet, Rfl: 0   fluticasone (FLONASE) 50 MCG/ACT nasal spray, Place 1 spray into both nostrils daily as needed for allergies or rhinitis., Disp: , Rfl:    oseltamivir (TAMIFLU) 75 MG capsule, Take 1 capsule (75 mg total) by mouth 2 (two) times daily for 5 days., Disp: 10 capsule, Rfl: 0  Allergies  Allergen Reactions   Amoxicillin Swelling   Penicillins Swelling   Cefprozil    Elavil [Amitriptyline] Other (See Comments)    Made her feel faint like she was going to pass out   Fluoxetine Anxiety    Objective:   BP 120/80   Pulse 100   Temp (!) 102.1 F (38.9 C) (Temporal)   Ht '5\' 1"'$  (1.549 m)   Wt 104 lb 3.2 oz (47.3 kg)   LMP  (LMP Unknown)   SpO2 98%   BMI 19.69 kg/m    Physical  Exam Constitutional:      General: She is not in acute distress.    Appearance: Normal appearance. She is normal weight. She is not ill-appearing, toxic-appearing or diaphoretic.  HENT:     Head: Normocephalic.     Right Ear: Tympanic membrane normal.     Left Ear: Tympanic membrane normal.     Nose: Congestion present.     Right Turbinates: Swollen.     Right Sinus: Maxillary sinus tenderness present.     Left Sinus: Maxillary sinus tenderness present.     Mouth/Throat:     Mouth: Mucous membranes are dry.     Pharynx: Posterior oropharyngeal erythema present. No oropharyngeal exudate.  Eyes:     Extraocular Movements: Extraocular movements intact.     Pupils: Pupils are equal, round, and reactive to light.  Cardiovascular:     Rate and Rhythm: Normal rate and regular rhythm.     Pulses: Normal pulses.     Heart sounds: Normal heart sounds.  Pulmonary:     Effort: Pulmonary effort is normal.     Breath sounds: Normal breath sounds.  Musculoskeletal:     Cervical back: Normal range of motion.  Lymphadenopathy:     Cervical: Cervical adenopathy present.     Right cervical: Superficial cervical adenopathy present.  Left cervical: Superficial cervical adenopathy present.  Neurological:     General: No focal deficit present.     Mental Status: She is alert and oriented to person, place, and time. Mental status is at baseline.  Psychiatric:        Mood and Affect: Mood normal.        Behavior: Behavior normal.        Thought Content: Thought content normal.        Judgment: Judgment normal.     Assessment & Plan:  Fever, unspecified fever cause -     POCT Influenza A/B -     POC COVID-19 BinaxNow  Influenza B Assessment & Plan: Rx tamiflu 75 mg po bid x 5 days.  Advied pt to stay out of work the rest of this week.  Increase fluids Tylenol/ibuprofen for fever and rest.  Did recommend pt get flu vaccine next year  Orders: -     Oseltamivir Phosphate; Take 1 capsule  (75 mg total) by mouth 2 (two) times daily for 5 days.  Dispense: 10 capsule; Refill: 0  Contact with and (suspected) exposure to covid-19 -     POC COVID-19 BinaxNow     Follow up plan: No follow-ups on file.  Eugenia Pancoast, FNP

## 2022-11-04 NOTE — Assessment & Plan Note (Signed)
Rx tamiflu 75 mg po bid x 5 days.  Advied pt to stay out of work the rest of this week.  Increase fluids Tylenol/ibuprofen for fever and rest.  Did recommend pt get flu vaccine next year

## 2022-11-07 ENCOUNTER — Encounter: Payer: Self-pay | Admitting: Family

## 2023-04-25 ENCOUNTER — Encounter: Payer: Self-pay | Admitting: Internal Medicine

## 2023-04-25 ENCOUNTER — Ambulatory Visit: Payer: 59 | Admitting: Internal Medicine

## 2023-04-25 ENCOUNTER — Encounter: Payer: Self-pay | Admitting: Family

## 2023-04-25 VITALS — BP 110/80 | HR 101 | Temp 98.4°F | Ht 61.0 in | Wt 99.8 lb

## 2023-04-25 DIAGNOSIS — R4020 Unspecified coma: Secondary | ICD-10-CM

## 2023-04-25 NOTE — Progress Notes (Signed)
Subjective:    Patient ID: Jenny Cain, female    DOB: 26-Jun-1996, 27 y.o.   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 About 10:30 PM Flashing lights came on and she can't remember what happened then Boyfriend said she wouldn't talk or respond to him appropriately for about 5 minutes No abnormal movements No incontinence  Usually gets up 6AM Not a great sleeper--uses benedryl nightly (then does okay once she initiates) Works as Sales executive to [employer] (she lives in [place])  Current Outpatient Medications on File Prior to Visit  Medication Sig Dispense Refill   albuterol (VENTOLIN HFA) 108 (90 Base) MCG/ACT inhaler Inhale 2 puffs into the lungs every 6 (six) hours as needed for wheezing or shortness of breath. 8 g 0   fluticasone (FLONASE) 50 MCG/ACT nasal spray Place 1 spray into both nostrils daily as needed for allergies or rhinitis.     butalbital-acetaminophen-caffeine (FIORICET) 50-325-40 MG tablet Take 1 tablet by mouth every 6 (six) hours as needed for headache. Do not exceed 6 per days (Patient not taking: Reported on 04/25/2023) 14 tablet 0   No current facility-administered medications on file prior to visit.    Allergies  Allergen Reactions   Amoxicillin Swelling   Penicillins Swelling   Cefprozil    Elavil [Amitriptyline] Other (See Comments)    Made her feel faint like she was going to pass out   Fluoxetine Anxiety    Past Medical History:  Diagnosis Date   Anxiety    Asthma    Depression    IBS (irritable bowel syndrome)    Insomnia    Lyme disease    Migraine    Premature baby    Seizures (HCC)     Past Surgical History:  Procedure Laterality Date   NISSEN FUNDOPLICATION      Family History  Problem Relation Age of Onset   Depression Mother    Migraines Mother    Depression  Father    Multiple sclerosis Father    Heart attack Father    Ulcerative colitis Father    Breast cancer Maternal Aunt    Heart attack Maternal Grandmother    Heart attack Paternal Grandmother     Social History   Socioeconomic History   Marital status: Single    Spouse name: Not on file   Number of children: Not on file   Years of education: Not on file   Highest education level: Not on file  Occupational History   Occupation: Audiological scientist  Tobacco Use   Smoking status: Former    Current packs/day: 0.00    Average packs/day: 0.1 packs/day for 2.0 years (0.2 ttl pk-yrs)    Types: Cigarettes    Start date: 2013    Quit date: 2015    Years since quitting: 9.6   Smokeless tobacco: Never   Tobacco comments:    smoked in high school  Vaping Use   Vaping status: Some Days   Devices: disposable  Substance and Sexual Activity   Alcohol use: Yes    Comment: once a month   Drug use: No   Sexual activity: Yes    Partners: Male    Birth control/protection: Condom    Comment: engaged  Other Topics Concern   Not on file  Social History Narrative   Not on  file   Social Determinants of Health   Financial Resource Strain: Not on file  Food Insecurity: Not on file  Transportation Needs: Not on file  Physical Activity: Not on file  Stress: Not on file  Social Connections: Unknown (01/19/2022)   Received from [facility]   Social Network    Social Network: Not on file  Intimate Partner Violence: Unknown (01/19/2022)   Received from [facility], [facility]   HITS    Physically Hurt: Not on file    Insult or Talk Down To: Not on file    Threaten Physical Harm: Not on file    Scream or Curse: Not on file   Review of Systems Not much caffeine--one cup of coffee daily at most No fever recently and no illnesses Has been eating okay    Objective:   Physical Exam Constitutional:      Appearance: Normal appearance.  Neurological:     Mental Status:  She is alert and oriented to person, place, and time.     Cranial Nerves: Cranial nerves 2-12 are intact.     Motor: No weakness or tremor.     Coordination: Romberg sign negative. Finger-Nose-Finger Test normal.     Gait: Gait normal.  Psychiatric:        Mood and Affect: Mood normal.        Behavior: Behavior normal.            Assessment & Plan:

## 2023-04-25 NOTE — Addendum Note (Signed)
Addended by: Tillman Abide I on: 04/25/2023 01:09 PM   Modules accepted: Orders

## 2023-04-25 NOTE — Addendum Note (Signed)
Addended by: Vincenza Hews on: 04/25/2023 01:55 PM   Modules accepted: Orders

## 2023-04-25 NOTE — Assessment & Plan Note (Signed)
Unclear situation Did really pass out--but had abnormal spell with decreased awareness. Witnessed by boyfriend. 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 Will set up with neurology---will likely need EEG I don't think there is a reason to limit driving, etc---no evidence of true convulsive seizure and risk of similar spell seems quite remote---urged caution though

## 2023-04-26 ENCOUNTER — Encounter: Payer: Self-pay | Admitting: *Deleted

## 2023-05-02 ENCOUNTER — Telehealth: Payer: Self-pay

## 2023-05-02 NOTE — Telephone Encounter (Signed)
Unable to reach Received: Today Maisie Fus, CMA  Karie Schwalbe, MD Cc: Eual Fines, CMA After several attempts to reach the patient, we have been unsuccessful. The patient is not returning calls and not responding to Mychart messages.  I have been trying to reach her to see where she wants her referral sent. If she has a location in mind. In the referral it states "Probably [facility]" but I am trying to reach the patient to discuss any specific location she may want it sent.  Thanks.  Nadara Eaton, CMA Referral Coordinator  Referral Referral # [id number]        Referral Information  Referral # Creation Date Referral Status Status Update   [id number] 04/25/2023 Pending Review 04/25/2023: Status History     Status Reason Referral Type Referral Reasons Referral Class  Not Enough Information Consultation Specialty Services Required Internal     To Specialty To Provider To Location/Place of Service To Department  Neurology none none none     To Vendor Referred By By Location/Place of Service By Department  none Karie Schwalbe, MD [facility]     Priority Start Date Expiration Date Referral Entered By  Urgent 04/25/2023 04/24/2024 Karie Schwalbe, MD     Visits Requested Visits Authorized Visits Completed Visits Scheduled  1 1       Procedure Information  Service Details Procedure Modifiers Provider Requested Approved  [id number] - AMB REFERRAL TO NEUROLOGY none  1 1    Diagnosis Information  Diagnosis  R40.20 (ICD-10-CM) - Loss of consciousness [facility])    Referral Notes Number of Notes: 3 . Type Date User Summary Attachment  General 05/02/2023  3:44 PM Karie Schwalbe, MD Auto: Referral message -  Note: ----- Message ----- From: Karie Schwalbe, MD Sent: 05/02/2023   3:44 PM EDT To: Maisie Fus, CMA Subject: RE: Unable to reach                             Okay Thanks for trying  She is not my patient so I don't  know what is the issue ----- Message ----- From: Maisie Fus, CMA Sent: 05/02/2023   3:28 PM EDT To: Karie Schwalbe, MD; Eual Fines, CMA Subject: Unable to reach                                 After several attempts to reach the patient, we have been unsuccessful.  The patient is not returning calls and not responding to Mychart messages.    I have been trying to reach her to see where she wants her referral sent. If she has a location in mind. In the referral it states "Probably [facility]" but I am trying to reach the patient to discuss any specific location she may want it sent.    Thanks.   Nadara Eaton, CMA Referral Coordinator  . Type Date User Summary Attachment  General 05/02/2023  3:28 PM Maisie Fus, CMA Auto: Referral message -  Note: ----- Message ----- From: Maisie Fus, CMA Sent: 05/02/2023   3:28 PM EDT To: Karie Schwalbe, MD; Eual Fines, CMA Subject: Unable to reach  After several attempts to reach the patient, we have been unsuccessful.  The patient is not returning calls and not responding to Mychart messages.    I have been trying to reach her to see where she wants her referral sent. If she has a location in mind. In the referral it states "Probably [facility]" but I am trying to reach the patient to discuss any specific location she may want it sent.    Thanks.   Nadara Eaton, CMA Referral Coordinator  . 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 Needs appt ASAP to evaluate Lives in [place] now--probably [facility]

## 2023-05-03 NOTE — Telephone Encounter (Signed)
Letter has been generated and mailed to pt's home address on file.

## 2023-05-03 NOTE — Telephone Encounter (Signed)
Jenny Cain pool, Can we please send a letter to pt to let her know we have tried to reach her in regards to her neurology referral?

## 2023-05-03 NOTE — Telephone Encounter (Signed)
Thank you Dr. Alphonsus Sias for seeing her. I appreciate you updating me as well.

## 2023-05-04 ENCOUNTER — Encounter: Payer: Self-pay | Admitting: *Deleted

## 2023-12-17 DIAGNOSIS — N390 Urinary tract infection, site not specified: Secondary | ICD-10-CM

## 2023-12-17 HISTORY — DX: Urinary tract infection, site not specified: N39.0

## 2023-12-18 ENCOUNTER — Emergency Department

## 2023-12-18 ENCOUNTER — Inpatient Hospital Stay
Admission: EM | Admit: 2023-12-18 | Discharge: 2023-12-20 | DRG: 770 | Disposition: A | Discharge status: Home / Self Care | Attending: Obstetrics and Gynecology | Admitting: Obstetrics and Gynecology

## 2023-12-18 ENCOUNTER — Other Ambulatory Visit: Payer: Self-pay

## 2023-12-18 DIAGNOSIS — Z87891 Personal history of nicotine dependence: Secondary | ICD-10-CM

## 2023-12-18 DIAGNOSIS — R109 Unspecified abdominal pain: Secondary | ICD-10-CM

## 2023-12-18 DIAGNOSIS — Z8249 Family history of ischemic heart disease and other diseases of the circulatory system: Secondary | ICD-10-CM

## 2023-12-18 DIAGNOSIS — N10 Acute tubulo-interstitial nephritis: Secondary | ICD-10-CM | POA: Diagnosis present

## 2023-12-18 DIAGNOSIS — R10A1 Flank pain, right side: Secondary | ICD-10-CM

## 2023-12-18 DIAGNOSIS — N12 Tubulo-interstitial nephritis, not specified as acute or chronic: Secondary | ICD-10-CM | POA: Diagnosis not present

## 2023-12-18 DIAGNOSIS — O021 Missed abortion: Secondary | ICD-10-CM | POA: Diagnosis present

## 2023-12-18 DIAGNOSIS — O039 Complete or unspecified spontaneous abortion without complication: Principal | ICD-10-CM

## 2023-12-18 DIAGNOSIS — Z3A08 8 weeks gestation of pregnancy: Secondary | ICD-10-CM | POA: Diagnosis not present

## 2023-12-18 HISTORY — DX: Disorder of kidney and ureter, unspecified: N28.9

## 2023-12-18 LAB — COMPREHENSIVE METABOLIC PANEL WITH GFR
ALT: 14 U/L (ref 0–44)
AST: 20 U/L (ref 15–41)
Albumin: 4.4 g/dL (ref 3.5–5.0)
Alkaline Phosphatase: 41 U/L (ref 38–126)
Anion gap: 9 (ref 5–15)
BUN: 9 mg/dL (ref 6–20)
CO2: 22 mmol/L (ref 22–32)
Calcium: 9 mg/dL (ref 8.9–10.3)
Chloride: 100 mmol/L (ref 98–111)
Creatinine, Ser: 0.5 mg/dL (ref 0.44–1.00)
GFR, Estimated: >60 mL/min (ref >60)
Glucose, Bld: 90 mg/dL (ref 70–99)
Potassium: 3.6 mmol/L (ref 3.5–5.1)
Sodium: 131 mmol/L — ABNORMAL LOW (ref 135–145)
Total Bilirubin: 1.3 mg/dL — ABNORMAL HIGH (ref 0.0–1.2)
Total Protein: 7.3 g/dL (ref 6.5–8.1)

## 2023-12-18 LAB — CBC
HCT: 36.4 % (ref 36.0–46.0)
Hemoglobin: 13 g/dL (ref 12.0–15.0)
MCH: 33.4 pg (ref 26.0–34.0)
MCHC: 35.7 g/dL (ref 30.0–36.0)
MCV: 93.6 fL (ref 80.0–100.0)
Platelets: 165 10*3/uL (ref 150–400)
RBC: 3.89 MIL/uL (ref 3.87–5.11)
RDW: 11.7 % (ref 11.5–15.5)
WBC: 12.7 10*3/uL — ABNORMAL HIGH (ref 4.0–10.5)
nRBC: 0 % (ref 0.0–0.2)

## 2023-12-18 LAB — URINALYSIS, ROUTINE W REFLEX MICROSCOPIC
Bilirubin Urine: NEGATIVE
Glucose, UA: NEGATIVE mg/dL
Hgb urine dipstick: NEGATIVE
Ketones, ur: 20 mg/dL — AB
Leukocytes,Ua: NEGATIVE
Nitrite: NEGATIVE
Protein, ur: 30 mg/dL — AB
Specific Gravity, Urine: 1.017 (ref 1.005–1.030)
pH: 6 (ref 5.0–8.0)

## 2023-12-18 LAB — LIPASE, BLOOD: Lipase: 25 U/L (ref 11–51)

## 2023-12-18 LAB — LACTIC ACID, PLASMA: Lactic Acid, Venous: 1.1 mmol/L (ref 0.5–1.9)

## 2023-12-18 LAB — HCG, QUANTITATIVE, PREGNANCY: hCG, Beta Chain, Quant, S: 156251 m[IU]/mL — ABNORMAL HIGH (ref <5)

## 2023-12-18 LAB — ABO/RH: ABO/RH(D): O NEG

## 2023-12-18 MED ORDER — MORPHINE SULFATE (PF) 4 MG/ML IV SOLN
4.0000 mg | Freq: Once | INTRAVENOUS | Status: AC
  Administered 2023-12-18: 4 mg via INTRAVENOUS
  Filled 2023-12-18: qty 1

## 2023-12-18 MED ORDER — SODIUM CHLORIDE 0.9 % IV BOLUS
1000.0000 mL | Freq: Once | INTRAVENOUS | Status: AC
  Administered 2023-12-18: 1000 mL via INTRAVENOUS

## 2023-12-18 MED ORDER — ONDANSETRON HCL 4 MG/2ML IJ SOLN
4.0000 mg | Freq: Four times a day (QID) | INTRAMUSCULAR | Status: DC | PRN
  Administered 2023-12-18 – 2023-12-19 (×3): 4 mg via INTRAVENOUS
  Filled 2023-12-18 (×3): qty 2

## 2023-12-18 MED ORDER — ACETAMINOPHEN 500 MG PO TABS
1000.0000 mg | ORAL_TABLET | Freq: Once | ORAL | Status: DC
  Filled 2023-12-18: qty 2

## 2023-12-18 MED ORDER — LEVOFLOXACIN IN D5W 750 MG/150ML IV SOLN
750.0000 mg | INTRAVENOUS | Status: DC
  Administered 2023-12-19 – 2023-12-20 (×2): 750 mg via INTRAVENOUS
  Filled 2023-12-18 (×3): qty 150

## 2023-12-18 MED ORDER — IBUPROFEN 600 MG PO TABS
600.0000 mg | ORAL_TABLET | Freq: Four times a day (QID) | ORAL | Status: DC | PRN
Start: 2023-12-18 — End: 2023-12-20
  Administered 2023-12-18 – 2023-12-20 (×5): 600 mg via ORAL
  Filled 2023-12-18 (×6): qty 1

## 2023-12-18 MED ORDER — ONDANSETRON HCL 4 MG/2ML IJ SOLN
4.0000 mg | Freq: Once | INTRAMUSCULAR | Status: AC
  Administered 2023-12-18: 4 mg via INTRAVENOUS
  Filled 2023-12-18: qty 2

## 2023-12-18 MED ORDER — MORPHINE SULFATE (PF) 2 MG/ML IV SOLN
2.0000 mg | INTRAVENOUS | Status: DC | PRN
  Administered 2023-12-18 – 2023-12-19 (×5): 2 mg via INTRAVENOUS
  Filled 2023-12-18 (×5): qty 1

## 2023-12-18 MED ORDER — LACTATED RINGERS IV SOLN
INTRAVENOUS | Status: AC
Start: 2023-12-18 — End: 2023-12-19

## 2023-12-18 MED ORDER — IOHEXOL 300 MG/ML  SOLN
100.0000 mL | Freq: Once | INTRAMUSCULAR | Status: AC | PRN
  Administered 2023-12-18: 75 mL via INTRAVENOUS

## 2023-12-18 MED ORDER — SODIUM CHLORIDE 0.9 % IV BOLUS (SEPSIS)
1000.0000 mL | Freq: Once | INTRAVENOUS | Status: AC
  Administered 2023-12-18: 1000 mL via INTRAVENOUS

## 2023-12-18 MED ORDER — HYDROCODONE-ACETAMINOPHEN 5-325 MG PO TABS
1.0000 | ORAL_TABLET | ORAL | Status: AC | PRN
  Administered 2023-12-18 – 2023-12-20 (×5): 2 via ORAL
  Filled 2023-12-18 (×5): qty 2

## 2023-12-18 MED ORDER — LEVOFLOXACIN IN D5W 750 MG/150ML IV SOLN
750.0000 mg | Freq: Once | INTRAVENOUS | Status: AC
  Administered 2023-12-18: 750 mg via INTRAVENOUS
  Filled 2023-12-18: qty 150

## 2023-12-18 MED ORDER — ACETAMINOPHEN 160 MG/5ML PO SOLN
650.0000 mg | Freq: Once | ORAL | Status: AC
  Administered 2023-12-18: 650 mg via ORAL
  Filled 2023-12-18: qty 20.3

## 2023-12-18 NOTE — ED Notes (Signed)
 Pain has returned, pt crying, EDP informed.

## 2023-12-18 NOTE — Progress Notes (Signed)
 CODE SEPSIS - PHARMACY COMMUNICATION  **Broad Spectrum Antibiotics should be administered within 1 hour of Sepsis diagnosis**  Time Code Sepsis Called/Page Received: 1431  Antibiotics Ordered: levofloxacin  Time of 1st antibiotic administration: 1442  Additional action taken by pharmacy: None  If necessary, Name of Provider/Nurse Contacted: None   Ananias Balls ,PharmD Clinical Pharmacist  12/18/2023  2:32 PM

## 2023-12-18 NOTE — ED Notes (Signed)
 Pt asking for diet order, wrote to provider.

## 2023-12-18 NOTE — ED Notes (Signed)
 Pt seen at different hospital last night and diagnosed with UTI and possible kidney infection. Pt endorses pain to R flank and bladder area. No vaginal bleeding. Was about [redacted] weeks pregnant, went for first U/S 3 days ago and FHR was 60. Last night when went to hospital, told had had SAB. Pt and partner crying. EDP at bedside.

## 2023-12-18 NOTE — ED Notes (Signed)
Cnm at bedside

## 2023-12-18 NOTE — ED Notes (Signed)
 Pt in CT. Tylenol has arrived from pharmacy.

## 2023-12-18 NOTE — ED Notes (Signed)
Avised nurse that patient has ready bed

## 2023-12-18 NOTE — ED Provider Notes (Signed)
 [facility] Provider Note    Event Date/Time   First MD Initiated Contact with Patient 12/18/23 [phone]     (approximate)   History   UTI   HPI  Jenny Cain is a 28 y.o. female department with abdominal pain and flank pain.  Patient states that she was evaluated yesterday when she was out of town at [facility] and was diagnosed with a urinary tract infection.  States that she was given her first dose of nitrofurantoin but was unable to fill her medications because the pharmacy was closed.  States that she is having significantly worsening pain to her right side.  Associated with some nausea and vomiting.  Ongoing burning with urination.  States that she was [redacted] weeks gestational age and went to get her initial ultrasound, initial ultrasound was on Thursday and showed a heart rate of 60.  Was scheduled to have a repeat ultrasound this coming Wednesday however states that she had an ultrasound done yesterday and was told that " she lost the fetus and it was not viable" denies passing any products of conception or vaginal bleeding.  Denies prior history of a resistant urinary tract infection.  Denies prior history of kidney stone.  History of Niesen fundoplication and G-tube as a child.     Physical Exam   Triage Vital Signs: ED Triage Vitals [12/18/23 0951]  Encounter Vitals Group     BP (!) 139/96     Systolic BP Percentile      Diastolic BP Percentile      Pulse Rate (!) 118     Resp 19     Temp 98 F (36.7 C)     Temp src      SpO2 100 %     Weight 96 lb 9.6 oz (43.8 kg)     Height 5' (1.524 m)     Head Circumference      Peak Flow      Pain Score 10     Pain Loc      Pain Education      Exclude from Growth Chart     Most recent vital signs: Vitals:   12/18/23 1330 12/18/23 1400  BP:    Pulse: (!) 106   Resp:    Temp:  (!) 102.6 F (39.2 C)  SpO2: 100%     Physical Exam Exam conducted with a chaperone present.   Constitutional:      General: She is in acute distress.     Appearance: She is well-developed.  HENT:     Head: Atraumatic.  Eyes:     Extraocular Movements: Extraocular movements intact.     Conjunctiva/sclera: Conjunctivae normal.     Pupils: Pupils are equal, round, and reactive to light.  Cardiovascular:     Rate and Rhythm: Regular rhythm. Tachycardia present.  Pulmonary:     Effort: No respiratory distress.  Abdominal:     General: There is no distension.     Tenderness: There is abdominal tenderness. There is right CVA tenderness.  Musculoskeletal:        General: Normal range of motion.     Cervical back: Normal range of motion.     Right lower leg: No edema.     Left lower leg: No edema.  Skin:    General: Skin is warm.     Capillary Refill: Capillary refill takes less than 2 seconds.  Neurological:     Mental Status: She is  alert. Mental status is at baseline.  Psychiatric:        Mood and Affect: Mood normal.     IMPRESSION / MDM / ASSESSMENT AND PLAN / ED COURSE  I reviewed the triage vital signs and the nursing notes.  Past medical history significant for pyelonephritis, intrauterine fetal demise, urinary tract infection, pyelonephritis  Sinus tachycardia while on cardiac telemetry.  RADIOLOGY I independently reviewed imaging, my interpretation of imaging: Ultrasound with a nonviable pregnancy at approximately [redacted] weeks gestational age with no cardiac activity.  LABS (all labs ordered are listed, but only abnormal results are displayed) Labs interpreted as -    Labs Reviewed  COMPREHENSIVE METABOLIC PANEL WITH GFR - Abnormal; Notable for the following components:      Result Value   Sodium 131 (*)    Total Bilirubin 1.3 (*)    All other components within normal limits  CBC - Abnormal; Notable for the following components:   WBC 12.7 (*)    All other components within normal limits  URINALYSIS, ROUTINE W REFLEX MICROSCOPIC - Abnormal; Notable for the  following components:   Color, Urine YELLOW (*)    APPearance HAZY (*)    Ketones, ur 20 (*)    Protein, ur 30 (*)    Bacteria, UA RARE (*)    All other components within normal limits  HCG, QUANTITATIVE, PREGNANCY - Abnormal; Notable for the following components:   hCG, Beta Chain, Quant, S 156,251 (*)    All other components within normal limits  URINE CULTURE  CULTURE, BLOOD (ROUTINE X 2)  CULTURE, BLOOD (ROUTINE X 2)  LIPASE, BLOOD  LACTIC ACID, PLASMA  LACTIC ACID, PLASMA  ABO/RH     MDM  Patient's quant 156,000.  Ultrasound with no viable pregnancy.  Findings concerning for urinary tract infection with leukocytosis.  Became febrile in the emergency department up to 102.6.  Tachycardic.  Given p.o. Tylenol and IV fluids.  Multiple allergies to antibiotics and ultimately started on IV Levaquin for complicated pyelonephritis given her on nonviable pregnancy.  Consulted and discussed the patient's case with obstetrician on-call Dr. Denman Fischer who will admit the patient for pyelonephritis and intrauterine fetal demise.  CT scan pending at time of admission.     PROCEDURES:  Critical Care performed: yes  .Critical Care  Performed by: Viviano Ground, MD Authorized by: Viviano Ground, MD   Critical care provider statement:    Critical care time (minutes):  30   Critical care time was exclusive of:  Separately billable procedures and treating other patients   Critical care was necessary to treat or prevent imminent or life-threatening deterioration of the following conditions:  Sepsis   Critical care was time spent personally by me on the following activities:  Development of treatment plan with patient or surrogate, discussions with consultants, evaluation of patient's response to treatment, examination of patient, ordering and review of laboratory studies, ordering and review of radiographic studies, ordering and performing treatments and interventions, pulse oximetry, re-evaluation  of patient's condition and review of old charts   Care discussed with: admitting provider     Patient's presentation is most consistent with acute presentation with potential threat to life or bodily function.   MEDICATIONS ORDERED IN ED: Medications  sodium chloride 0.9 % bolus 1,000 mL (has no administration in time range)  acetaminophen (TYLENOL) 160 MG/5ML solution 650 mg (has no administration in time range)  lactated ringers infusion (has no administration in time range)  sodium chloride 0.9 %  bolus 1,000 mL (has no administration in time range)  levofloxacin (LEVAQUIN) IVPB 750 mg (has no administration in time range)  sodium chloride 0.9 % bolus 1,000 mL (0 mLs Intravenous Stopped 12/18/23 1154)  ondansetron (ZOFRAN) injection 4 mg (4 mg Intravenous Given 12/18/23 1030)  morphine (PF) 4 MG/ML injection 4 mg (4 mg Intravenous Given 12/18/23 1030)  morphine (PF) 4 MG/ML injection 4 mg (4 mg Intravenous Given 12/18/23 1153)    FINAL CLINICAL IMPRESSION(S) / ED DIAGNOSES   Final diagnoses:  Miscarriage at 8 to [redacted] weeks gestation  Pyelonephritis     Rx / DC Orders   ED Discharge Orders     None        Note:  This document was prepared using Dragon voice recognition software and may include unintentional dictation errors.   Viviano Ground, MD 12/18/23 1438

## 2023-12-18 NOTE — ED Notes (Signed)
 Pt requesting IV morphine, messaged provider.

## 2023-12-18 NOTE — Sepsis Progress Note (Signed)
 Sepsis protocol is being followed by [facility].

## 2023-12-18 NOTE — ED Notes (Signed)
 Pt to u/s

## 2023-12-18 NOTE — H&P (Signed)
 Subjective:  Patient is a 28 y.o. G1 P0 female presents with right flank pain. Onset of symptoms was gradual starting 2 weeks ago with gradually worsening course since that time. Severe pain started 48 hours ago. The pain is located in the right flank with some pelvic pain. Patient describes the pain as aching, continuous and rated as severe. Pain has been associated with fever, dysuria, or frequency. The patient denies a history of pyelonephritis. The patient denies a history of UTIs. The patient has nausea, abdominal pain. The patient denies vaginal discharge. Pregnancy status is positive with miscarriage diagnosed yesterday. There is no history of kidney disease, diabetes or kidney stones. She has a history of Nissen Fundoplication as a child to treat GERD. She is unable to vomit due to the surgery.  LMP was 10/18/23. Gestational age [redacted]w[redacted]d by LMP. Fetal heart rate was 60 on 4/10 at initial ultrasound at [facility] in [place]. She was in [place] over the weekend when her flank pain worsened and she was seen at [facility] yesterday. No cardiac activity on u/s while there. She was diagnosed with UTI and was discharged with prescription for PO antibiotics. She was unable to pick up prescription since the pharmacy had closed. Pain was worse today and she came to [facility].  Patient Active Problem List   Diagnosis Date Noted   Pyelonephritis 12/18/2023   Loss of consciousness (HCC) 04/25/2023   Influenza B 11/04/2022   Positive ANA (antinuclear antibody) 12/17/2021   Positive Lyme disease serology 11/26/2021   TBI (traumatic brain injury) (HCC) 10/13/2021   Restless legs syndrome 10/13/2021   Vitamin D deficiency 10/13/2021   Polyarthralgia 10/13/2021   Chronic fatigue 10/13/2021   Allergic rhinitis 10/13/2021   Acute non intractable tension-type headache 10/13/2021   Asthma, well controlled 03/26/2019   Mixed anxiety and depressive disorder 02/12/2013   Insomnia 02/12/2013    Past Medical History:  Diagnosis Date   Anxiety    Asthma    Depression    IBS (irritable bowel syndrome)    Insomnia    Lyme disease    Migraine    Premature baby    Renal disorder    age 77, had cyst on kidney   Seizures (HCC)    UTI (urinary tract infection) 12/17/2023    Past Surgical History:  Procedure Laterality Date   NISSEN FUNDOPLICATION      Medications Prior to Admission  Medication Sig Dispense Refill Last Dose/Taking   albuterol (VENTOLIN HFA) 108 (90 Base) MCG/ACT inhaler Inhale 2 puffs into the lungs every 6 (six) hours as needed for wheezing or shortness of breath. (Patient not taking: Reported on 12/18/2023) 8 g 0 Not Taking   butalbital-acetaminophen-caffeine (FIORICET) 50-325-40 MG tablet Take 1 tablet by mouth every 6 (six) hours as needed for headache. Do not exceed 6 per days (Patient not taking: Reported on 04/25/2023) 14 tablet 0 Not Taking   fluticasone (FLONASE) 50 MCG/ACT nasal spray Place 1 spray into both nostrils daily as needed for allergies or rhinitis. (Patient not taking: Reported on 12/18/2023)   Not Taking   Allergies  Allergen Reactions   Amoxicillin Swelling   Penicillins Swelling   Cefprozil    Elavil [Amitriptyline] Other (See Comments)    Made her feel faint like she was going to pass out   Latex     Other Reaction(s): Not available   Fluoxetine Anxiety    Social History   Tobacco Use   Smoking status: Former  Current packs/day: 0.00    Average packs/day: 0.1 packs/day for 2.0 years (0.2 ttl pk-yrs)    Types: Cigarettes    Start date: 2013    Quit date: 2015    Years since quitting: 10.2   Smokeless tobacco: Never   Tobacco comments:    smoked in high school  Substance Use Topics   Alcohol use: Yes    Comment: once a month    Family History  Problem Relation Age of Onset   Depression Mother    Migraines Mother    Depression Father    Multiple sclerosis Father    Heart attack Father    Ulcerative colitis Father     Breast cancer Maternal Aunt    Heart attack Maternal Grandmother    Heart attack Paternal Grandmother     Review of Systems Review of Systems  Constitutional:  Positive for fever and malaise/fatigue. Negative for chills.  HENT:  Negative for congestion, ear discharge, ear pain, hearing loss, sinus pain and sore throat.   Eyes:  Negative for blurred vision and double vision.  Respiratory:  Negative for cough, shortness of breath and wheezing.   Cardiovascular:  Negative for chest pain, palpitations and leg swelling.  Gastrointestinal:  Positive for abdominal pain and nausea. Negative for blood in stool, constipation, diarrhea, heartburn, melena and vomiting.  Genitourinary:  Positive for dysuria, flank pain, frequency and urgency. Negative for hematuria.  Musculoskeletal:  Positive for back pain. Negative for joint pain and myalgias.  Skin:  Negative for itching and rash.  Neurological:  Negative for dizziness, tingling, tremors, sensory change, speech change, focal weakness, seizures, loss of consciousness, weakness and headaches.  Endo/Heme/Allergies:  Negative for environmental allergies. Does not bruise/bleed easily.  Psychiatric/Behavioral:  Negative for depression, hallucinations, memory loss, substance abuse and suicidal ideas. The patient is not nervous/anxious and does not have insomnia.     Objective: Vital signs in last 24 hours: Temp:  [98 F (36.7 C)-102.6 F (39.2 C)] 99.3 F (37.4 C) (04/13 1736) Pulse Rate:  [102-133] 120 (04/13 1736) Resp:  [17-24] 18 (04/13 1736) BP: (117-162)/(59-114) 123/74 (04/13 1736) SpO2:  [96 %-100 %] 96 % (04/13 1736) Weight:  [43.8 kg] 43.8 kg (04/13 0951)  Vital Signs: BP 123/74 (BP Location: Left Arm)   Pulse (!) 120   Temp 99.3 F (37.4 C) (Oral)   Resp 18   Ht 5' (1.524 m)   Wt 43.8 kg   SpO2 96%   BMI 18.87 kg/m  Constitutional: Well nourished, well developed female in acute distress.  HEENT: normal Skin: Warm and dry.   Cardiovascular: Tachycardic   Extremity:  no edema   Respiratory: Clear to auscultation bilateral. Normal respiratory effort Abdomen: mild pelvic tenderness Back: Right side flank pain Psych: Alert and Oriented x3. No memory deficits. Normal mood and affect.    Data Review:  Latest Reference Range & Units 12/18/23 10:06 12/18/23 13:27 12/18/23 14:09 12/18/23 15:17  COMPREHENSIVE METABOLIC PANEL WITH GFR  Rpt !     Sodium 135 - 145 mmol/L 131 (L)     Potassium 3.5 - 5.1 mmol/L 3.6     Chloride 98 - 111 mmol/L 100     CO2 22 - 32 mmol/L 22     Glucose 70 - 99 mg/dL 90     BUN 6 - 20 mg/dL 9     Creatinine 4.09 - 1.00 mg/dL 8.11     Calcium 8.9 - 10.3 mg/dL 9.0     Anion gap  5 - 15  9     Alkaline Phosphatase 38 - 126 U/L 41     Albumin 3.5 - 5.0 g/dL 4.4     Lipase 11 - 51 U/L 25     AST 15 - 41 U/L 20     ALT 0 - 44 U/L 14     Total Protein 6.5 - 8.1 g/dL 7.3     Total Bilirubin 0.0 - 1.2 mg/dL 1.3 (H)     GFR, Estimated >60 mL/min >60     Lactic Acid, Venous 0.5 - 1.9 mmol/L   1.1   WBC 4.0 - 10.5 K/uL 12.7 (H)     RBC 3.87 - 5.11 MIL/uL 3.89     Hemoglobin 12.0 - 15.0 g/dL 09.8     HCT 11.9 - 14.7 % 36.4     MCV 80.0 - 100.0 fL 93.6     MCH 26.0 - 34.0 pg 33.4     MCHC 30.0 - 36.0 g/dL 82.9     RDW 56.2 - 13.0 % 11.7     Platelets 150 - 400 K/uL 165     nRBC 0.0 - 0.2 % 0.0     HCG, Beta Chain, Quant, S <5 mIU/mL 865,784 (H)     ABO/RH(D)  O NEG Performed at [facility], [address] [place], [place] [address]      URINALYSIS, ROUTINE W REFLEX MICROSCOPIC  Rpt !     Appearance CLEAR  HAZY !     Bilirubin Urine NEGATIVE  NEGATIVE     Color, Urine YELLOW  YELLOW !     Glucose, UA NEGATIVE mg/dL NEGATIVE     Hgb urine dipstick NEGATIVE  NEGATIVE     Ketones, ur NEGATIVE mg/dL 20 !     Leukocytes,Ua NEGATIVE  NEGATIVE     Nitrite NEGATIVE  NEGATIVE     pH 5.0 - 8.0  6.0     Protein NEGATIVE mg/dL 30 !     Specific Gravity, Urine 1.005 - 1.030  1.017      Bacteria, UA NONE SEEN  RARE !     Mucus  PRESENT     RBC / HPF 0 - 5 RBC/hpf 0-5     Squamous Epithelial / HPF 0 - 5 /HPF 0-5     WBC, UA 0 - 5 WBC/hpf 11-20     CULTURE, BLOOD (ROUTINE X 2) W REFLEX TO ID PANEL    Rpt (IP) Rpt (IP)   URINE CULTURE  Rpt (IP)     CT ABDOMEN PELVIS W CONTRAST     Rpt  US  OB TRANSVAGINAL   Rpt    !: Data is abnormal (L): Data is abnormally low (H): Data is abnormally high (IP): In Process Rpt: View report in Results Review for more information  CLINICAL DATA:  UTI, recurrent/complicated. Abdominal and flank pain.   EXAM: CT ABDOMEN AND PELVIS WITH CONTRAST   TECHNIQUE: Multidetector CT imaging of the abdomen and pelvis was performed using the standard protocol following bolus administration of intravenous contrast.   RADIATION DOSE REDUCTION: This exam was performed according to the departmental dose-optimization program which includes automated exposure control, adjustment of the mA and/or kV according to patient size and/or use of iterative reconstruction technique.   CONTRAST:  75mL OMNIPAQUE IOHEXOL 300 MG/ML  SOLN   COMPARISON:  01/19/2021, 12/18/2023.   FINDINGS: Lower chest: No acute abnormality.   Hepatobiliary: No focal liver abnormality is seen. No gallstones, gallbladder wall  thickening, or biliary dilatation.   Pancreas: Unremarkable. No pancreatic ductal dilatation or surrounding inflammatory changes.   Spleen: Normal in size without focal abnormality.   Adrenals/Urinary Tract: The adrenal glands are within normal limits. There is patchy hypoenhancement in the right kidney. No renal calculus or hydronephrosis is seen bilaterally. There is a enhancement of the right renal pelvis and ureter with surrounding fat stranding and edema. The bladder is unremarkable.   Stomach/Bowel: Surgical changes are present in the gastroesophageal region. The stomach is otherwise within normal limits. No bowel obstruction, free air,  or pneumatosis is seen. Appendix appears normal.   Vascular/Lymphatic: No significant vascular findings are present. No enlarged abdominal or pelvic lymph nodes.   Reproductive: The uterus is markedly enlarged and heterogeneous. Fluid attenuation in the uterus, previously characterized as failed early pregnancy versus missed abortion.   Other: A trace amount of perinephric free fluid is noted on the right.   Musculoskeletal: No acute osseous abnormality.   IMPRESSION: Findings compatible with right pyelonephritis/ureteritis. No obstructive uropathy is seen.   Electronically Signed   By: Wyvonnia Heimlich M.D.   On: 12/18/2023 15:48  Urine culture and Blood cultures pending    Assessment/Plan: Principal Problem:   Pyelonephritis   Sepsis   Failed early pregnancy/missed abortion .   Indications for hospitalization: severe signs and symptoms  1. IV Antibiotics: Levaquin 750 mg Q 24 hours 2. IV hydration 3. Imaging of urinary system: CT scan done in ED, TV Ob ultrasound done in ED 4. Advance diet as tolerated 5. Analgesics: Norco PRN, Morphine IV breakthrough pain, Ibuprofen moderate pain 6. Antiemetics: zofran 4 mg IV every 6 hours PRN 7. TED hose 8. Management of missed abortion discussed  9. Follow up with OB provider

## 2023-12-18 NOTE — ED Notes (Signed)
 Messaged pharmacy for liquid tylenol.

## 2023-12-18 NOTE — ED Triage Notes (Addendum)
 Pt comes with dx of UTI and kidney infection. Pt was seen at a ED yesterday. Pt was prescribed med and sent home. Pt has not been able to get meds due to pharmacy not being open on Sunday. Pt in intense pain.   Pt also just found out she had last night miscarriage, pt was about [redacted] weeks along. Pt was on vacation and has not been to her OBGYN to follow up.

## 2023-12-19 ENCOUNTER — Encounter: Payer: Self-pay | Admitting: Obstetrics and Gynecology

## 2023-12-19 DIAGNOSIS — Z3A08 8 weeks gestation of pregnancy: Secondary | ICD-10-CM

## 2023-12-19 DIAGNOSIS — O021 Missed abortion: Principal | ICD-10-CM

## 2023-12-19 LAB — URINE CULTURE: Culture: NO GROWTH

## 2023-12-19 MED ORDER — DOXYCYCLINE HYCLATE 100 MG PO TABS
200.0000 mg | ORAL_TABLET | Freq: Once | ORAL | Status: AC
  Administered 2023-12-20: 200 mg via ORAL
  Filled 2023-12-19 (×2): qty 2

## 2023-12-19 MED ORDER — PROMETHAZINE HCL 25 MG PO TABS
12.5000 mg | ORAL_TABLET | Freq: Four times a day (QID) | ORAL | Status: DC | PRN
  Administered 2023-12-19: 12.5 mg via ORAL
  Filled 2023-12-19: qty 1

## 2023-12-19 MED ORDER — ACETAMINOPHEN 500 MG PO TABS
1000.0000 mg | ORAL_TABLET | ORAL | Status: AC
  Administered 2023-12-20: 1000 mg via ORAL

## 2023-12-19 MED ORDER — SODIUM CHLORIDE 0.9 % IV SOLN: INTRAVENOUS | Status: AC | PRN

## 2023-12-19 MED ORDER — LACTATED RINGERS IV SOLN: INTRAVENOUS | Status: DC

## 2023-12-19 MED ORDER — SOD CITRATE-CITRIC ACID 500-334 MG/5ML PO SOLN
30.0000 mL | ORAL | Status: DC
  Filled 2023-12-19: qty 30

## 2023-12-19 NOTE — Progress Notes (Signed)
 [facility] ANTEPARTUM PROGRESS NOTE  Jenny Cain is a 28 y.o. G1P0 admitted for pyelonephritis and missed ab, approximately 8-9wks by LMP.  Length of Stay:  1 Days. Admitted 12/18/2023  Subjective:  Patient with boyfriend, his father, and her own father present and doing much better than yesterday. Denies fever/chills, feels flank pain is improving, and denies cramping or vaginal bleeding. Pt was aware of the pregnancy loss prior to her admission, has been counseled on management during this admission, and has decided on D&C.    Vitals:  Blood pressure 103/61, pulse 91, temperature 98.4 F (36.9 C), temperature source Oral, resp. rate 18, height 5' (1.524 m), weight 43.8 kg, SpO2 98%. Physical Examination: CONSTITUTIONAL: Well-developed, well-nourished female in no acute distress.  HENT:  Normocephalic, atraumatic EYES: Conjunctivae and EOM are normal.  NECK: Normal range of motion SKIN: Skin is warm and dry. No rash noted. Not diaphoretic. No erythema. No pallor. NEUROLGIC: Alert and oriented to person, place, and time.  PSYCHIATRIC: Normal mood and affect. Normal behavior. Normal judgment and thought content. CARDIOVASCULAR: Normal heart rate noted, regular rhythm RESPIRATORY: Effort and breath sounds normal, no problems with respiration noted MUSCULOSKELETAL: Normal range of motion. No edema and no tenderness. 2+ distal pulses. ABDOMEN: Soft, nontender, nondistended   Results for orders placed or performed during the hospital encounter of 12/18/23 (from the past 48 hours)  Lipase, blood     Status: None   Collection Time: 12/18/23 10:06 AM  Result Value Ref Range   Lipase 25 11 - 51 U/L    Comment: Performed at [facility], [address] [place], [place] [address]  Comprehensive metabolic panel     Status: Abnormal   Collection Time: 12/18/23 10:06 AM  Result Value Ref Range   Sodium 131 (L) 135 - 145 mmol/L   Potassium 3.6 3.5 - 5.1 mmol/L   Chloride  100 98 - 111 mmol/L   CO2 22 22 - 32 mmol/L   Glucose, Bld 90 70 - 99 mg/dL    Comment: Glucose reference range applies only to samples taken after fasting for at least 8 hours.   BUN 9 6 - 20 mg/dL   Creatinine, Ser 6.04 0.44 - 1.00 mg/dL   Calcium 9.0 8.9 - 54.0 mg/dL   Total Protein 7.3 6.5 - 8.1 g/dL   Albumin 4.4 3.5 - 5.0 g/dL   AST 20 15 - 41 U/L   ALT 14 0 - 44 U/L   Alkaline Phosphatase 41 38 - 126 U/L   Total Bilirubin 1.3 (H) 0.0 - 1.2 mg/dL   GFR, Estimated >98 >11 mL/min    Comment: (NOTE) Calculated using the CKD-EPI Creatinine Equation (2021)    Anion gap 9 5 - 15    Comment: Performed at [facility], [address] [place], [place] [address]  CBC     Status: Abnormal   Collection Time: 12/18/23 10:06 AM  Result Value Ref Range   WBC 12.7 (H) 4.0 - 10.5 K/uL   RBC 3.89 3.87 - 5.11 MIL/uL   Hemoglobin 13.0 12.0 - 15.0 g/dL   HCT 29.5 62.1 - 30.8 %   MCV 93.6 80.0 - 100.0 fL   MCH 33.4 26.0 - 34.0 pg   MCHC 35.7 30.0 - 36.0 g/dL   RDW 65.7 84.6 - 96.2 %   Platelets 165 150 - 400 K/uL   nRBC 0.0 0.0 - 0.2 %    Comment: Performed at [facility], [address] [place]  [address] [place], [place] [address]  Urinalysis, Routine w reflex microscopic -Urine, Random     Status: Abnormal   Collection Time: 12/18/23 10:06 AM  Result Value Ref Range   Color, Urine YELLOW (A) YELLOW   APPearance HAZY (A) CLEAR   Specific Gravity, Urine 1.017 1.005 - 1.030   pH 6.0 5.0 - 8.0   Glucose, UA NEGATIVE NEGATIVE mg/dL   Hgb urine dipstick NEGATIVE NEGATIVE   Bilirubin Urine NEGATIVE NEGATIVE   Ketones, ur 20 (A) NEGATIVE mg/dL   Protein, ur 30 (A) NEGATIVE mg/dL   Nitrite NEGATIVE NEGATIVE   Leukocytes,Ua NEGATIVE NEGATIVE   RBC / HPF 0-5 0 - 5 RBC/hpf   WBC, UA 11-20 0 - 5 WBC/hpf   Bacteria, UA RARE (A) NONE SEEN   Squamous Epithelial / HPF 0-5 0 - 5 /HPF   Mucus PRESENT     Comment: Performed at [facility], [address] [place], [place] [address]   ABO/Rh     Status: None   Collection Time: 12/18/23 10:06 AM  Result Value Ref Range   ABO/RH(D)      [place] NEG Performed at [facility], [address] [place], [place] [address]   hCG, quantitative, pregnancy     Status: Abnormal   Collection Time: 12/18/23 10:06 AM  Result Value Ref Range   hCG, Beta Chain, Quant, S 156,251 (H) <5 mIU/mL    Comment:          GEST. AGE      CONC.  (mIU/mL)   <=1 WEEK        5 - 50     2 WEEKS       50 - 500     3 WEEKS       100 - 10,000     4 WEEKS     1,000 - 30,000     5 WEEKS     3,500 - 115,000   6-8 WEEKS     12,000 - 270,000    12 WEEKS     15,000 - 220,000        FEMALE AND NON-PREGNANT FEMALE:     LESS THAN 5 mIU/mL Performed at [facility], [address] [place], [place] [address]   Urine Culture (for pregnant, neutropenic or urologic patients or patients with an indwelling urinary catheter)     Status: None   Collection Time: 12/18/23 10:06 AM   Specimen: Urine, Clean Catch  Result Value Ref Range   Specimen Description      URINE, CLEAN CATCH Performed at [facility], [address], [place], [place] [address]    Special Requests      NONE Performed at [facility], [address], [place], [place] [address]    Culture      NO GROWTH Performed at [facility], [address], [place], [place] [address]    Report Status 12/19/2023 FINAL   Blood culture (routine x 2)     Status: None (Preliminary result)   Collection Time: 12/18/23  2:09 PM   Specimen: BLOOD  Result Value Ref Range   Specimen Description BLOOD RIGHT ANTECUBITAL    Special Requests      BOTTLES DRAWN AEROBIC AND ANAEROBIC Blood Culture adequate volume   Culture      NO GROWTH < 24 HOURS Performed at [facility], [address], [place], [place] [address]    Report Status PENDING   Blood culture (  routine x 2)     Status: None (Preliminary result)   Collection Time: 12/18/23  2:09 PM    Specimen: BLOOD  Result Value Ref Range   Specimen Description BLOOD BLOOD RIGHT FOREARM    Special Requests      BOTTLES DRAWN AEROBIC AND ANAEROBIC Blood Culture results may not be optimal due to an inadequate volume of blood received in culture bottles   Culture      NO GROWTH < 24 HOURS Performed at [facility], [address] [place], [place] [address]    Report Status PENDING   Lactic acid, plasma     Status: None   Collection Time: 12/18/23  2:09 PM  Result Value Ref Range   Lactic Acid, Venous 1.1 0.5 - 1.9 mmol/L    Comment: Performed at [facility], [address] [place], [place] [address]    CT ABDOMEN PELVIS W CONTRAST Result Date: 12/18/2023 CLINICAL DATA:  UTI, recurrent/complicated. Abdominal and flank pain. EXAM: CT ABDOMEN AND PELVIS WITH CONTRAST TECHNIQUE: Multidetector CT imaging of the abdomen and pelvis was performed using the standard protocol following bolus administration of intravenous contrast. RADIATION DOSE REDUCTION: This exam was performed according to the departmental dose-optimization program which includes automated exposure control, adjustment of the mA and/or kV according to patient size and/or use of iterative reconstruction technique. CONTRAST:  75mL OMNIPAQUE IOHEXOL 300 MG/ML  SOLN COMPARISON:  01/19/2021, 12/18/2023. FINDINGS: Lower chest: No acute abnormality. Hepatobiliary: No focal liver abnormality is seen. No gallstones, gallbladder wall thickening, or biliary dilatation. Pancreas: Unremarkable. No pancreatic ductal dilatation or surrounding inflammatory changes. Spleen: Normal in size without focal abnormality. Adrenals/Urinary Tract: The adrenal glands are within normal limits. There is patchy hypoenhancement in the right kidney. No renal calculus or hydronephrosis is seen bilaterally. There is a enhancement of the right renal pelvis and ureter with surrounding fat stranding and edema. The bladder is unremarkable.  Stomach/Bowel: Surgical changes are present in the gastroesophageal region. The stomach is otherwise within normal limits. No bowel obstruction, free air, or pneumatosis is seen. Appendix appears normal. Vascular/Lymphatic: No significant vascular findings are present. No enlarged abdominal or pelvic lymph nodes. Reproductive: The uterus is markedly enlarged and heterogeneous. Fluid attenuation in the uterus, previously characterized as failed early pregnancy versus missed abortion. Other: A trace amount of perinephric free fluid is noted on the right. Musculoskeletal: No acute osseous abnormality. IMPRESSION: Findings compatible with right pyelonephritis/ureteritis. No obstructive uropathy is seen. Electronically Signed   By: Thornell Sartorius M.D.   On: 12/18/2023 15:48   US OB Transvaginal Result Date: 12/18/2023 CLINICAL DATA:  abd pain, concern for miscarriage. Gestational age by last menstrual period 8 weeks and 5 days. Last menstrual period 10/18/2023 EXAM: TRANSVAGINAL OB ULTRASOUND TECHNIQUE: Transvaginal ultrasound was performed for complete evaluation of the gestation as well as the maternal uterus, adnexal regions, and pelvic cul-de-sac. COMPARISON:  None Available. FINDINGS: Intrauterine gestational sac: Single Yolk sac:  Visualized. Embryo:  Visualized. Cardiac Activity: Not Visualized. CRL:   22.4 mm   9 w 0 d                  [place] EDC: 07/22/2024 Subchorionic hemorrhage:  None visualized. Maternal uterus/adnexae: Bilateral ovaries are unremarkable. No free pelvic fluid. IMPRESSION: Intrauterine pregnancy with gestational age by ultrasound of 9 weeks and 0 days is concordant with gestational age by last menstrual period 8 weeks 5 days) with no cardiac activity. Findings meet definitive criteria for failed pregnancy and  a missed abortion. This follows SRU consensus guidelines: Diagnostic Criteria for Nonviable Pregnancy Early in the First Trimester. Ole Berkeley J Med [id number]. Electronically Signed    By: Morgane  Naveau M.D.   On: 12/18/2023 13:44    Current scheduled medications  [START ON 12/20/2023] acetaminophen  1,000 mg Oral On Call to OR   [START ON 12/20/2023] sodium citrate-citric acid  30 mL Oral On Call to OR    I have reviewed the patient's current medications.  ASSESSMENT: Jenny Cain is a 28 y.o. G1P0 admitted with pyelonephritis at [redacted]w[redacted]d by LMP, with missed Ab confirmed on US  12/18/23. Pt started on IV abx and condition is improving, last fever 102.6 on 12/18/23 at 1400. Urine culture to date is negative, as well as blood cultures. Pt has elected for D&C prior to discharge. We reviewed R/B/A of this procedure and verbal consent was given today. Discussed with anesthesia and they prefer to postpone case until tomorrow due to her recent fever yesterday.   PLAN: -Continue IV abx until afebrile x 48hrs, then plan for discharge on PO course of Levaquin 750mg  x 10d -Will move forward with D&C tomorrow, 12/20/23; NPO at [place]  Disposition: consider DC home tomorrow after D&C if has remained afebrile and no complications from surgery.     Jenny Dunn, DO [facility]

## 2023-12-20 ENCOUNTER — Encounter
Admission: EM | Disposition: A | Discharge status: Home / Self Care | Payer: Self-pay | Source: Home / Self Care | Attending: Obstetrics and Gynecology

## 2023-12-20 ENCOUNTER — Inpatient Hospital Stay: Admitting: Certified Registered"

## 2023-12-20 ENCOUNTER — Other Ambulatory Visit: Payer: Self-pay

## 2023-12-20 ENCOUNTER — Encounter: Payer: Self-pay | Admitting: Obstetrics and Gynecology

## 2023-12-20 ENCOUNTER — Inpatient Hospital Stay

## 2023-12-20 DIAGNOSIS — N12 Tubulo-interstitial nephritis, not specified as acute or chronic: Secondary | ICD-10-CM | POA: Diagnosis not present

## 2023-12-20 DIAGNOSIS — R109 Unspecified abdominal pain: Secondary | ICD-10-CM | POA: Diagnosis not present

## 2023-12-20 HISTORY — PX: DILATION AND EVACUATION: SHX1459

## 2023-12-20 LAB — CBC WITH DIFFERENTIAL/PLATELET
Abs Immature Granulocytes: 0.02 10*3/uL (ref 0.00–0.07)
Basophils Absolute: 0 10*3/uL (ref 0.0–0.1)
Basophils Relative: 0 %
Eosinophils Absolute: 0 10*3/uL (ref 0.0–0.5)
Eosinophils Relative: 0 %
HCT: 29.1 % — ABNORMAL LOW (ref 36.0–46.0)
Hemoglobin: 10.5 g/dL — ABNORMAL LOW (ref 12.0–15.0)
Immature Granulocytes: 0 %
Lymphocytes Relative: 6 %
Lymphs Abs: 0.6 10*3/uL — ABNORMAL LOW (ref 0.7–4.0)
MCH: 33.3 pg (ref 26.0–34.0)
MCHC: 36.1 g/dL — ABNORMAL HIGH (ref 30.0–36.0)
MCV: 92.4 fL (ref 80.0–100.0)
Monocytes Absolute: 0.8 10*3/uL (ref 0.1–1.0)
Monocytes Relative: 9 %
Neutro Abs: 7.4 10*3/uL (ref 1.7–7.7)
Neutrophils Relative %: 85 %
Platelets: 122 10*3/uL — ABNORMAL LOW (ref 150–400)
RBC: 3.15 MIL/uL — ABNORMAL LOW (ref 3.87–5.11)
RDW: 11.7 % (ref 11.5–15.5)
WBC: 8.8 10*3/uL (ref 4.0–10.5)
nRBC: 0 % (ref 0.0–0.2)

## 2023-12-20 LAB — COMPREHENSIVE METABOLIC PANEL WITH GFR
ALT: 15 U/L (ref 0–44)
AST: 20 U/L (ref 15–41)
Albumin: 3.3 g/dL — ABNORMAL LOW (ref 3.5–5.0)
Alkaline Phosphatase: 38 U/L (ref 38–126)
Anion gap: 7 (ref 5–15)
BUN: <5 mg/dL — ABNORMAL LOW (ref 6–20)
CO2: 19 mmol/L — ABNORMAL LOW (ref 22–32)
Calcium: 7.8 mg/dL — ABNORMAL LOW (ref 8.9–10.3)
Chloride: 104 mmol/L (ref 98–111)
Creatinine, Ser: 0.44 mg/dL (ref 0.44–1.00)
GFR, Estimated: >60 mL/min (ref >60)
Glucose, Bld: 105 mg/dL — ABNORMAL HIGH (ref 70–99)
Potassium: 3.4 mmol/L — ABNORMAL LOW (ref 3.5–5.1)
Sodium: 130 mmol/L — ABNORMAL LOW (ref 135–145)
Total Bilirubin: 0.6 mg/dL (ref 0.0–1.2)
Total Protein: 5.7 g/dL — ABNORMAL LOW (ref 6.5–8.1)

## 2023-12-20 SURGERY — DILATION AND EVACUATION, UTERUS
Anesthesia: General

## 2023-12-20 MED ORDER — DOCUSATE SODIUM 100 MG PO CAPS
100.0000 mg | ORAL_CAPSULE | Freq: Two times a day (BID) | ORAL | Status: DC
Start: 2023-12-20 — End: 2023-12-20
  Administered 2023-12-20: 100 mg via ORAL
  Filled 2023-12-20: qty 1

## 2023-12-20 MED ORDER — PROPOFOL 1000 MG/100ML IV EMUL
INTRAVENOUS | Status: AC
  Filled 2023-12-20: qty 100

## 2023-12-20 MED ORDER — OXYCODONE HCL 5 MG PO TABS
ORAL_TABLET | ORAL | Status: AC
  Filled 2023-12-20: qty 1

## 2023-12-20 MED ORDER — CHLORHEXIDINE GLUCONATE 0.12 % MT SOLN
15.0000 mL | Freq: Once | OROMUCOSAL | Status: AC
  Administered 2023-12-20: 15 mL via OROMUCOSAL

## 2023-12-20 MED ORDER — 0.9 % SODIUM CHLORIDE (POUR BTL) OPTIME
TOPICAL | Status: DC | PRN
  Administered 2023-12-20: 500 mL

## 2023-12-20 MED ORDER — POLYETHYLENE GLYCOL 3350 17 G PO PACK: 17.0000 g | PACK | Freq: Every day | ORAL | Status: DC | PRN

## 2023-12-20 MED ORDER — ACETAMINOPHEN 500 MG PO TABS
ORAL_TABLET | ORAL | Status: AC
  Filled 2023-12-20: qty 2

## 2023-12-20 MED ORDER — DEXMEDETOMIDINE HCL IN NACL 200 MCG/50ML IV SOLN
INTRAVENOUS | Status: DC | PRN
Start: 2023-12-20 — End: 2023-12-20
  Administered 2023-12-20: 12 ug via INTRAVENOUS

## 2023-12-20 MED ORDER — MIDAZOLAM HCL 2 MG/2ML IJ SOLN
INTRAMUSCULAR | Status: AC
  Filled 2023-12-20: qty 2

## 2023-12-20 MED ORDER — FENTANYL CITRATE (PF) 100 MCG/2ML IJ SOLN
INTRAMUSCULAR | Status: DC | PRN
  Administered 2023-12-20 (×2): 25 ug via INTRAVENOUS

## 2023-12-20 MED ORDER — ONDANSETRON HCL 4 MG/2ML IJ SOLN
INTRAMUSCULAR | Status: DC | PRN
  Administered 2023-12-20 (×2): 4 mg via INTRAVENOUS

## 2023-12-20 MED ORDER — FENTANYL CITRATE (PF) 100 MCG/2ML IJ SOLN
INTRAMUSCULAR | Status: AC
  Filled 2023-12-20: qty 2

## 2023-12-20 MED ORDER — GLYCOPYRROLATE 0.2 MG/ML IJ SOLN
INTRAMUSCULAR | Status: DC | PRN
  Administered 2023-12-20: .2 mg via INTRAVENOUS

## 2023-12-20 MED ORDER — OXYCODONE HCL 5 MG PO TABS
5.0000 mg | ORAL_TABLET | Freq: Once | ORAL | Status: AC
  Administered 2023-12-20: 5 mg via ORAL

## 2023-12-20 MED ORDER — LEVOFLOXACIN 750 MG PO TABS: 750.0000 mg | ORAL_TABLET | Freq: Every day | ORAL | 0 refills | Status: AC

## 2023-12-20 MED ORDER — MIDAZOLAM HCL 2 MG/2ML IJ SOLN
INTRAMUSCULAR | Status: DC | PRN
  Administered 2023-12-20: 2 mg via INTRAVENOUS

## 2023-12-20 MED ORDER — DEXAMETHASONE SODIUM PHOSPHATE 10 MG/ML IJ SOLN
INTRAMUSCULAR | Status: DC | PRN
  Administered 2023-12-20: 10 mg via INTRAVENOUS

## 2023-12-20 MED ORDER — DROPERIDOL 2.5 MG/ML IJ SOLN: 0.6250 mg | Freq: Once | INTRAMUSCULAR | Status: DC | PRN

## 2023-12-20 MED ORDER — LIDOCAINE HCL (PF) 1 % IJ SOLN
INTRAMUSCULAR | Status: AC
  Filled 2023-12-20: qty 30

## 2023-12-20 MED ORDER — MONSELS FERRIC SUBSULFATE EX SOLN
CUTANEOUS | Status: AC
  Filled 2023-12-20: qty 8

## 2023-12-20 MED ORDER — IBUPROFEN 600 MG PO TABS: 600.0000 mg | ORAL_TABLET | Freq: Four times a day (QID) | ORAL | 0 refills | Status: AC | PRN

## 2023-12-20 MED ORDER — CHLORHEXIDINE GLUCONATE 0.12 % MT SOLN
OROMUCOSAL | Status: AC
  Filled 2023-12-20: qty 15

## 2023-12-20 MED ORDER — POTASSIUM CHLORIDE CRYS ER 20 MEQ PO TBCR
40.0000 meq | EXTENDED_RELEASE_TABLET | ORAL | Status: AC
  Administered 2023-12-20: 40 meq via ORAL
  Filled 2023-12-20: qty 2

## 2023-12-20 MED ORDER — PROPOFOL 10 MG/ML IV BOLUS
INTRAVENOUS | Status: DC | PRN
  Administered 2023-12-20: 200 mg via INTRAVENOUS

## 2023-12-20 MED ORDER — LIDOCAINE HCL (CARDIAC) PF 100 MG/5ML IV SOSY
PREFILLED_SYRINGE | INTRAVENOUS | Status: DC | PRN
  Administered 2023-12-20: 100 mg via INTRATRACHEAL

## 2023-12-20 MED ORDER — CYCLOBENZAPRINE HCL 10 MG PO TABS
10.0000 mg | ORAL_TABLET | Freq: Three times a day (TID) | ORAL | Status: DC
  Administered 2023-12-20 (×2): 10 mg via ORAL
  Filled 2023-12-20 (×4): qty 1

## 2023-12-20 MED ORDER — FENTANYL CITRATE (PF) 100 MCG/2ML IJ SOLN: 25.0000 ug | INTRAMUSCULAR | Status: DC | PRN

## 2023-12-20 SURGICAL SUPPLY — 24 items
DRSG TELFA 3X8 NADH STRL (GAUZE/BANDAGES/DRESSINGS) ×1 IMPLANT
FILTER UTR ASPR SPEC (MISCELLANEOUS) ×1 IMPLANT
FLTR UTR ASPR SPEC (MISCELLANEOUS) ×1 IMPLANT
GLOVE BIO SURGEON STRL SZ 6 (GLOVE) ×1 IMPLANT
GLOVE BIOGEL PI IND STRL 6 (GLOVE) ×1 IMPLANT
GOWN STRL REUS W/ TWL LRG LVL3 (GOWN DISPOSABLE) ×1 IMPLANT
KIT BERKELEY 1ST TRIMESTER 3/8 (MISCELLANEOUS) ×1 IMPLANT
KIT TURNOVER KIT A (KITS) ×1 IMPLANT
NDL SPNL 20GX3.5 QUINCKE YW (NEEDLE) ×1 IMPLANT
NEEDLE SPNL 20GX3.5 QUINCKE YW (NEEDLE) ×1 IMPLANT
PACK DNC HYST (MISCELLANEOUS) ×1 IMPLANT
PAD OB MATERNITY 11 LF (PERSONAL CARE ITEMS) ×1 IMPLANT
PAD PREP OB/GYN DISP 24X41 (PERSONAL CARE ITEMS) ×1 IMPLANT
SCRUB CHG 4% DYNA-HEX 4OZ (MISCELLANEOUS) ×1 IMPLANT
SET BERKELEY SUCTION TUBING (SUCTIONS) ×1 IMPLANT
SOL PREP PVP 2OZ (MISCELLANEOUS) ×1 IMPLANT
SOLUTION PREP PVP 2OZ (MISCELLANEOUS) ×1 IMPLANT
SYR 10ML LL (SYRINGE) ×1 IMPLANT
TRAP FLUID SMOKE EVACUATOR (MISCELLANEOUS) ×1 IMPLANT
VACURETTE 10 RIGID CVD (CANNULA) IMPLANT
VACURETTE 12 RIGID CVD (CANNULA) IMPLANT
VACURETTE 7MM F TIP STRL (CANNULA) ×1 IMPLANT
VACURETTE 8 RIGID CVD (CANNULA) IMPLANT
WATER STERILE IRR 500ML POUR (IV SOLUTION) ×1 IMPLANT

## 2023-12-20 NOTE — Consult Note (Signed)
 Urology Consult  I have been asked to see the patient by Oley Balm, CNM, for evaluation and management of pyelonephritis.  Chief Complaint: Right flank pain, dysuria  History of Present Illness: Jenny Cain is a 28 y.o. year old female admitted on 12/18/2023 with right pyelonephritis in the setting of [facility] at 9 weeks.  Admission CTAP with contrast showed patchy hypoenhancement of the right kidney, right renal pelvis, and right ureter with surrounding stranding and edema consistent with pyelonephritis/ureteritis.  Admission UA was notable for 11-20 WBCs/hpf and rare bacteria; urine culture finalized with no growth, however it appears that she did receive antibiotics prior to presenting here.  Creatinine has been stable, now 0.44.  White count has improved since admission, now 8.8.  She is accompanied today by her boyfriend and family members at the bedside.  She reports feeling somewhat better this morning.  She is scheduled for D&C today.  Anti-infectives (From admission, onward)    Start     Dose/Rate Route Frequency Ordered Stop   12/20/23 0900  [MAR Hold]  doxycycline (VIBRA-TABS) tablet 200 mg        (MAR Hold since Tue 12/20/2023 at 0924.Hold Reason: Transfer to a Procedural area)   200 mg Oral  Once 12/19/23 1946 12/20/23 1220   12/19/23 1500  [MAR Hold]  levofloxacin (LEVAQUIN) IVPB 750 mg        (MAR Hold since Tue 12/20/2023 at 0924.Hold Reason: Transfer to a Procedural area)   750 mg 100 mL/hr over 90 Minutes Intravenous Every 24 hours 12/18/23 1725     12/18/23 1445  levofloxacin (LEVAQUIN) IVPB 750 mg        750 mg 100 mL/hr over 90 Minutes Intravenous  Once 12/18/23 1431 12/18/23 1629        Past Medical History:  Diagnosis Date   Anxiety    Asthma    Depression    IBS (irritable bowel syndrome)    Insomnia    Lyme disease    Migraine    Premature baby    Renal disorder    age 78, had cyst on kidney   Seizures (HCC)    UTI (urinary tract  infection) 12/17/2023    Past Surgical History:  Procedure Laterality Date   NISSEN FUNDOPLICATION      Home Medications:  No outpatient medications have been marked as taking for the 12/18/23 encounter [facility] Encounter).    Allergies:  Allergies  Allergen Reactions   Amoxicillin Swelling   Penicillins Swelling   Cefprozil    Elavil [Amitriptyline] Other (See Comments)    Made her feel faint like she was going to pass out   Latex     Other Reaction(s): Not available   Fluoxetine Anxiety    Family History  Problem Relation Age of Onset   Depression Mother    Migraines Mother    Depression Father    Multiple sclerosis Father    Heart attack Father    Ulcerative colitis Father    Breast cancer Maternal Aunt    Heart attack Maternal Grandmother    Heart attack Paternal Grandmother     Social History:  reports that she quit smoking about 10 years ago. Her smoking use included cigarettes. She started smoking about 12 years ago. She has a 0.2 pack-year smoking history. She has never used smokeless tobacco. She reports current alcohol use. She reports that she does not use drugs.  ROS: A complete review of systems was performed.  All systems  are negative except for pertinent findings as noted.  Physical Exam:  Vital signs in last 24 hours: Temp:  [97.7 F (36.5 C)-98.6 F (37 C)] 98 F (36.7 C) (04/15 1230) Pulse Rate:  [68-103] 103 (04/15 1230) Resp:  [12-20] 15 (04/15 1230) BP: (89-116)/(58-85) 111/72 (04/15 1230) SpO2:  [96 %-100 %] 100 % (04/15 1230) Weight:  [43.8 kg] 43.8 kg (04/15 0930) Constitutional:  Alert and oriented, no acute distress HEENT: [place] AT, moist mucus membranes Cardiovascular: No clubbing, cyanosis, or edema Respiratory: Normal respiratory effort Skin: No rashes, bruises or suspicious lesions Neurologic: Grossly intact, no focal deficits, moving all 4 extremities Psychiatric: Normal mood and affect  Laboratory Data:  Recent Labs     12/18/23 1006 12/20/23 0205  WBC 12.7* 8.8  HGB 13.0 10.5*  HCT 36.4 29.1*   Recent Labs    12/18/23 1006 12/20/23 0205  NA 131* 130*  K 3.6 3.4*  CL 100 104  CO2 22 19*  GLUCOSE 90 105*  BUN 9 <5*  CREATININE 0.50 0.44  CALCIUM 9.0 7.8*   Urinalysis    Component Value Date/Time   COLORURINE YELLOW (A) 12/18/2023 1006   APPEARANCEUR HAZY (A) 12/18/2023 1006   LABSPEC 1.017 12/18/2023 1006   PHURINE 6.0 12/18/2023 1006   GLUCOSEU NEGATIVE 12/18/2023 1006   HGBUR NEGATIVE 12/18/2023 1006   BILIRUBINUR NEGATIVE 12/18/2023 1006   KETONESUR 20 (A) 12/18/2023 1006   PROTEINUR 30 (A) 12/18/2023 1006   UROBILINOGEN 0.2 07/21/2008 1325   NITRITE NEGATIVE 12/18/2023 1006   LEUKOCYTESUR NEGATIVE 12/18/2023 1006   Results for orders placed or performed during the hospital encounter of 12/18/23  Urine Culture (for pregnant, neutropenic or urologic patients or patients with an indwelling urinary catheter)     Status: None   Collection Time: 12/18/23 10:06 AM   Specimen: Urine, Clean Catch  Result Value Ref Range Status   Specimen Description   Final    URINE, CLEAN CATCH Performed at [facility], [address] [place], [place] [address]    Special Requests   Final    NONE Performed at [facility], [address] [place], [place], [place] [address]    Culture   Final    NO GROWTH Performed at [facility], [address], [place], [place] [address]    Report Status 12/19/2023 FINAL  Final  Blood culture (routine x 2)     Status: None (Preliminary result)   Collection Time: 12/18/23  2:09 PM   Specimen: BLOOD  Result Value Ref Range Status   Specimen Description BLOOD RIGHT ANTECUBITAL  Final   Special Requests   Final    BOTTLES DRAWN AEROBIC AND ANAEROBIC Blood Culture adequate volume   Culture   Final    NO GROWTH 2 DAYS Performed at [facility], [address], [place], [place] [address]    Report Status PENDING  Incomplete  Blood  culture (routine x 2)     Status: None (Preliminary result)   Collection Time: 12/18/23  2:09 PM   Specimen: BLOOD  Result Value Ref Range Status   Specimen Description BLOOD BLOOD RIGHT FOREARM  Final   Special Requests   Final    BOTTLES DRAWN AEROBIC AND ANAEROBIC Blood Culture results may not be optimal due to an inadequate volume of blood received in culture bottles   Culture   Final    NO GROWTH 2 DAYS Performed at [facility], [address], [place], [place] [address]  Report Status PENDING  Incomplete    Radiologic Imaging: CT ABDOMEN PELVIS W CONTRAST Result Date: 12/18/2023 CLINICAL DATA:  UTI, recurrent/complicated. Abdominal and flank pain. EXAM: CT ABDOMEN AND PELVIS WITH CONTRAST TECHNIQUE: Multidetector CT imaging of the abdomen and pelvis was performed using the standard protocol following bolus administration of intravenous contrast. RADIATION DOSE REDUCTION: This exam was performed according to the departmental dose-optimization program which includes automated exposure control, adjustment of the mA and/or kV according to patient size and/or use of iterative reconstruction technique. CONTRAST:  75mL OMNIPAQUE IOHEXOL 300 MG/ML  SOLN COMPARISON:  01/19/2021, 12/18/2023. FINDINGS: Lower chest: No acute abnormality. Hepatobiliary: No focal liver abnormality is seen. No gallstones, gallbladder wall thickening, or biliary dilatation. Pancreas: Unremarkable. No pancreatic ductal dilatation or surrounding inflammatory changes. Spleen: Normal in size without focal abnormality. Adrenals/Urinary Tract: The adrenal glands are within normal limits. There is patchy hypoenhancement in the right kidney. No renal calculus or hydronephrosis is seen bilaterally. There is a enhancement of the right renal pelvis and ureter with surrounding fat stranding and edema. The bladder is unremarkable. Stomach/Bowel: Surgical changes are present in the gastroesophageal region. The stomach is  otherwise within normal limits. No bowel obstruction, free air, or pneumatosis is seen. Appendix appears normal. Vascular/Lymphatic: No significant vascular findings are present. No enlarged abdominal or pelvic lymph nodes. Reproductive: The uterus is markedly enlarged and heterogeneous. Fluid attenuation in the uterus, previously characterized as failed early pregnancy versus missed abortion. Other: A trace amount of perinephric free fluid is noted on the right. Musculoskeletal: No acute osseous abnormality. IMPRESSION: Findings compatible with right pyelonephritis/ureteritis. No obstructive uropathy is seen. Electronically Signed   By: Thornell Sartorius M.D.   On: 12/18/2023 15:48   US OB Transvaginal Result Date: 12/18/2023 CLINICAL DATA:  abd pain, concern for miscarriage. Gestational age by last menstrual period 8 weeks and 5 days. Last menstrual period 10/18/2023 EXAM: TRANSVAGINAL OB ULTRASOUND TECHNIQUE: Transvaginal ultrasound was performed for complete evaluation of the gestation as well as the maternal uterus, adnexal regions, and pelvic cul-de-sac. COMPARISON:  None Available. FINDINGS: Intrauterine gestational sac: Single Yolk sac:  Visualized. Embryo:  Visualized. Cardiac Activity: Not Visualized. CRL:   22.4 mm   9 w 0 d                  [place] EDC: 07/22/2024 Subchorionic hemorrhage:  None visualized. Maternal uterus/adnexae: Bilateral ovaries are unremarkable. No free pelvic fluid. IMPRESSION: Intrauterine pregnancy with gestational age by ultrasound of 9 weeks and 0 days is concordant with gestational age by last menstrual period 8 weeks 5 days) with no cardiac activity. Findings meet definitive criteria for failed pregnancy and a missed abortion. This follows SRU consensus guidelines: Diagnostic Criteria for Nonviable Pregnancy Early in the First Trimester. [facility] Mis J Med [id number]. Electronically Signed   By: Tish Frederickson M.D.   On: 12/18/2023 13:44   Assessment & Plan:  28 year old female  admitted with right pyelonephritis and MMC at 9 weeks.  She is clinically improving on empiric antibiotics.  It appears that she received antibiotics prior to presenting to our hospital, so I would recommend treating her for infectious pyelonephritis despite her negative urine culture.  We discussed that it can take up to 72 hours of culture appropriate therapy before you start seeing improvement in the setting of renal infections.  Recommendations: -Pain control and supportive care per primary team -Tissue-penetrating antibiotics x10 days for treatment of right pyelonephritis, would recommend Bactrim or Levaquin/Cipro on discharge  Thank  you for involving me in this patient's care, please page with any further questions or concerns.  Jennene Downie, PA-C 12/20/2023 12:33 PM

## 2023-12-20 NOTE — Discharge Summary (Signed)
 Discharge Summary  Patient ID: Jenny Cain MRN: [id number] DOB/AGE: 03-May-1996 28 y.o.  Admit date: 12/18/2023 Discharge date: 12/20/2023  Admission Diagnoses: Pyelonephritis, MAB  Discharge Diagnoses:  Principal Problem:   Right flank pain Active Problems:   Pyelonephritis   Missed abortion   Discharged Condition: good, stable  Hospital Course:  Patient is a 28 yo G1P0 female who presented to ED on 4/13 with severe right-sided flank pain that had been worsening over the past two weeks. She had a fever of 102.6 on presentation. She also reported taking antibiotics for previously diagnosed UTI. Her initial CT scan was consistent with right pyelonephritis/urethritis. She also was previously diagnosed with an MAB which was confirmed on [place] in the ED.   She was admitted for management of pyelonephritis and started on a course of IV antibiotics and pain medications. She remained afebrile during her admission and her WBC decreased from 12.7 to 8.8 during her stay. Additionally, her blood cultures and urine cultures returned negative. She was scheduled for a D&C in the setting of an MAB on 4/15.   On the evening of 4/14, she began to have increased pain that was unresponsive to morphine and PO pain medications. She was given a dose of flexeril that reduced the pain. Urology and the hospitalist service were both consulted regarding her increased pain. The hospitalist recommended a RUQ ultrasound due to mildly elevated bilirubin levels. The results of the [place] were negative for any gallbladder involvement. Her D&C was completed on the morning of 4/15, and after the [facility], she reported that her pain was well-controlled with PO medications and that she felt the pain may have been related to her body trying to pass the MAB.   Per urology consult, patient is stable for discharge with a 10 day course of PO levofloxacin. Patient to follow up with PCP after discharge for ongoing management of recurrent  UTIs.    Consults: urology and medicine  Significant Diagnostic Studies: labs: see lab results from admission  Rdiology: CT scan: pyelonephritis/urethritis and Ultrasound: RUQ: WNL, TVUS: MAB  Treatments: antibiotics: Levaquin, analgesia: acetaminophen, morphine, and ibuprofen, flexeril, and procedures: D&C (see Op note)  Discharge Exam: Blood pressure 111/75, pulse 92, temperature 98.1 F (36.7 C), temperature source Oral, resp. rate 17, height 5' (1.524 m), weight 43.8 kg, SpO2 99%. General appearance: alert and no distress Resp: normal work of breathing Cardio: normal heart rate  Disposition: Discharge disposition: 01-Home or Self Care       Discharge Instructions     Discharge instructions   Complete by: As directed    Home care Take it easy. Rest for 2 days as needed.  Go back to your normal activities after 24 to 48 hours. You may also return to work at that time.  Eat a normal diet.  Take an over-the-counter pain reliever if needed.  It's OK to have bleeding for about a week after the D&C. The amount of bleeding should be similar to what you have during a normal period.  Don't drive for 24 hours after the [facility] unless your provider says it's OK.  Don't have sex or use tampons or douches until your healthcare provider says it's safe.  Follow-up Make a follow-up appointment, or as advised.  When to call your healthcare provider Call your healthcare provider right away if you have any of these:  Bleeding that soaks more than 1 sanitary pad in 1 hour  Severe belly pain  Severe cramps  Fever of 100.62F (  38.0C) or higher, or as directed by your provider  A bad-smelling vaginal discharge      Allergies as of 12/20/2023       Reactions   Amoxicillin Swelling   Penicillins Swelling   Cefprozil    Elavil [amitriptyline] Other (See Comments)   Made her feel faint like she was going to pass out   Latex    Other Reaction(s): Not available   Fluoxetine  Anxiety        Medication List     TAKE these medications    albuterol 108 (90 Base) MCG/ACT inhaler Commonly known as: VENTOLIN HFA Inhale 2 puffs into the lungs every 6 (six) hours as needed for wheezing or shortness of breath.   butalbital-acetaminophen-caffeine 50-325-40 MG tablet Commonly known as: FIORICET Take 1 tablet by mouth every 6 (six) hours as needed for headache. Do not exceed 6 per days   fluticasone 50 MCG/ACT nasal spray Commonly known as: FLONASE Place 1 spray into both nostrils daily as needed for allergies or rhinitis.   ibuprofen 600 MG tablet Commonly known as: ADVIL Take 1 tablet (600 mg total) by mouth every 6 (six) hours as needed for moderate pain (pain score 4-6).   levofloxacin 750 MG tablet Commonly known as: Levaquin Take 1 tablet (750 mg total) by mouth daily for 10 days.         Signed: Verita Glassman Ash Mcelwain 12/20/2023, 5:46 PM

## 2023-12-20 NOTE — Progress Notes (Addendum)
 Call from RN regarding patient pain. Patient is admitted for management of pyelonephritis. She has received 2 doses of levofloxacin during her inpatient stay. Patient has received both IV morphine and PO pain medications without relief of pain symptoms. Earlier in the day, she reported improved pain but this is no longer the case. She remains afebrile with stable vital signs. She describes the pain at right flank that radiates to her front. No suprapubic pain. She is tearful on exam.   Spoke to Dr. Dell Fennel regarding patient. Reviewed CT scan from 4/13 which showed findings for right pyelonephritis/ureteritis. Reviewed negative urine and blood cultures so far. Will order CBC and CMP to assess for worsening infection and kidney function. Plan to consult hospitalist and urology. Will give flexeril for  possible musculoskeletal origin.   Hospitalist and urologist consults placed. Hospitalist paged through bed placement.   Patient is scheduled for [facility] tomorrow for known MAB at [redacted] weeks gestation.

## 2023-12-20 NOTE — Transfer of Care (Signed)
 Immediate Anesthesia Transfer of Care Note  Patient: Jenny Cain  Procedure(s) Performed: DILATION AND EVACUATION, UTERUS  Patient Location: PACU  Anesthesia Type:General  Level of Consciousness: awake, drowsy, and patient cooperative  Airway & Oxygen Therapy: Patient Spontanous Breathing and Patient connected to face mask oxygen  Post-op Assessment: Report given to RN and Post -op Vital signs reviewed and stable  Post vital signs: Reviewed and stable  Last Vitals:  Vitals Value Taken Time  BP 92/58 12/20/23 1145  Temp    Pulse 72 12/20/23 1151  Resp 13 12/20/23 1151  SpO2 100 % 12/20/23 1151  Vitals shown include unfiled device data.  Last Pain:  Vitals:   12/20/23 0930  TempSrc: Tympanic  PainSc: 3          Complications: No notable events documented.

## 2023-12-20 NOTE — Consult Note (Signed)
 Initial Consultation Note   Patient: Jenny Cain XBJ:[id number] DOB: [birth year] PCP: Patient, No Pcp Per DOA: 12/18/2023 DOS: the patient was seen and examined on 12/20/2023 Primary service: Hildred Laser, MD  Referring physician: Irving Burton Sl[facility]back/OBGYN service Reason for consult: Right flank pain  Assessment/Plan: Assessment and Plan:   Right flank pain, suspect musculoskeletal Possible renal vs biliary colic UTI ruled out with negative cultures Mildly elevated bilirubin -Suspect ureteritis as seen on CT, possibly noninfectious but from perhaps a recently passed ureteral stone - CT Abdo and pelvis on admission showed right pyelonephritis and urethritis without calculus -Urine culture negative - Possible noninfectious urethritis seen on CT?  Passed calculus -Given elevated bilirubin and negative urine cultures we will get a right upper quadrant ultrasound for further evaluation of gallbladder - Will defer pain management to primary team -follow-up right upper quadrant ultrasound-if negative will sign off  Missed abortion -Management per primary service    [facility] will continue to follow the patient.  HPI: Jenny Cain is a 29 y.o. female with past medical history of Medical consult for right flank pain on patient admitted on 4/13 to the OB/GYN service for right flank pain.  Transvaginal ultrasound revealed a nonviable fetus.  CT abdomen and pelvis showed findings compatible with right pyelonephritis/urethritis.  Patient was started on Levaquin for pyelonephritis however both blood and urine cultures returned negative.  Plan is for [facility] on 4/15 and discharged home with Levaquin. Consult was requested to assist in identifying any other potential etiology of right flank pain. Patient reports feeling the pain in the mid right flank and it radiates to the mid lower back.  She denies injury or falls.  Has had back pain before.  She denies history of kidney stones or passing a  stone. Chart review: Current vitals unremarkable.  Afebrile though pulse in the 90s Urine culture showed no growth and blood culture no growth to date WBC 12,700 CMP notable for total bilirubin of 1.3 with otherwise normal LFTs and lipase was normal at 25.  Review of Systems: As mentioned in the history of present illness. All other systems reviewed and are negative. Past Medical History:  Diagnosis Date   Anxiety    Asthma    Depression    IBS (irritable bowel syndrome)    Insomnia    Lyme disease    Migraine    Premature baby    Renal disorder    age 44, had cyst on kidney   Seizures (HCC)    UTI (urinary tract infection) 12/17/2023   Past Surgical History:  Procedure Laterality Date   NISSEN FUNDOPLICATION     Social History:  reports that she quit smoking about 10 years ago. Her smoking use included cigarettes. She started smoking about 12 years ago. She has a 0.2 pack-year smoking history. She has never used smokeless tobacco. She reports current alcohol use. She reports that she does not use drugs.  Allergies  Allergen Reactions   Amoxicillin Swelling   Penicillins Swelling   Cefprozil    Elavil [Amitriptyline] Other (See Comments)    Made her feel faint like she was going to pass out   Latex     Other Reaction(s): Not available   Fluoxetine Anxiety    Family History  Problem Relation Age of Onset   Depression Mother    Migraines Mother    Depression Father    Multiple sclerosis Father    Heart attack Father    Ulcerative colitis Father  Breast cancer Maternal Aunt    Heart attack Maternal Grandmother    Heart attack Paternal Grandmother     Prior to Admission medications   Medication Sig Start Date End Date Taking? Authorizing Provider  albuterol (VENTOLIN HFA) 108 (90 Base) MCG/ACT inhaler Inhale 2 puffs into the lungs every 6 (six) hours as needed for wheezing or shortness of breath. Patient not taking: Reported on 12/18/2023 10/13/21   Dugal, Tabitha,  FNP  butalbital-acetaminophen-caffeine (FIORICET) 50-325-40 MG tablet Take 1 tablet by mouth every 6 (six) hours as needed for headache. Do not exceed 6 per days Patient not taking: Reported on 04/25/2023 10/13/21   Dugal, Tabitha, FNP  fluticasone (FLONASE) 50 MCG/ACT nasal spray Place 1 spray into both nostrils daily as needed for allergies or rhinitis. Patient not taking: Reported on 12/18/2023    [provider]    Physical Exam: Vitals:   12/19/23 0830 12/19/23 1111 12/19/23 1508 12/19/23 2305  BP: 113/69 111/75 103/61 116/85  Pulse: 90 92 91 100  Resp: 20 19 18 20   Temp: 98.2 F (36.8 C) 97.8 F (36.6 C) 98.4 F (36.9 C) 98.6 F (37 C)  TempSrc: Oral Oral Oral Oral  SpO2: 98%   96%  Weight:      Height:       Physical Exam Vitals and nursing note reviewed.  Constitutional:      General: She is not in acute distress. HENT:     Head: Normocephalic and atraumatic.  Cardiovascular:     Rate and Rhythm: Normal rate and regular rhythm.     Heart sounds: Normal heart sounds.  Pulmonary:     Effort: Pulmonary effort is normal.     Breath sounds: Normal breath sounds.  Abdominal:     Palpations: Abdomen is soft.     Tenderness: There is no abdominal tenderness. There is no right CVA tenderness.  Musculoskeletal:     Comments: Painful area mostly in low right lateral flank, no costovertebral angle tenderness  Neurological:     Mental Status: Mental status is at baseline.     Data Reviewed: Relevant notes from primary care and specialist visits, past discharge summaries as available in EHR, including Care Everywhere. Prior diagnostic testing as pertinent to current admission diagnoses Updated medications and problem lists for reconciliation ED course, including vitals, labs, imaging, treatment and response to treatment Triage notes, nursing and pharmacy notes and ED provider's notes Notable results as noted in HPI   Family Communication: none Primary team  communication: yes Thank you very much for involving us  in the care of your patient.  Author: Lanetta Pion, MD 12/20/2023 2:03 AM  For on call review [email or web address].

## 2023-12-20 NOTE — Op Note (Signed)
  DILATION AND CURETTAGE OPERATIVE REPORT  DATE: 12/20/23   PRE-OP DIAGNOSIS: Missed abortion at 8-9 weeks  POST-OP DIAGNOSIS: Same  PROCEDURE: D&C  SURGEON: Dr. Sofia Dunn  ANESTHESIA: General  IVF: 0mL  EBL:  UOP:  COMPLICATIONS: None  SPECIMEN: Products of conception  FINDINGS: 9-week sized anteverted uterus with cervix closed; moderate amounts of products of conception  DRAINS: None  CONDITION: Stable to recovery room  PROCEDURE: The risks, benefits, alternatives and indications of the procedure were discussed with the patient. She voiced an understanding of the procedure and informed consent was obtained.  The patient was taken to the OR where general anesthesia was administered without difficulty. She was placed in the dorsal lithotomy position using the Allen stirrups. And exam under anesthesia revealed a 9-week sized anteverted uterus with a closed cervix. The patient was then prepped and draped in the normal sterile fashion.   A weighted speculum was inserted in the posterior aspect of the vagina. Bladder was drained sterilely with 200cc of clear urine out. A single-tooth tenaculum was used to grasp the anterior lip of the cervix. The cervix was dilated sequentially with Hegars dilators. The uterus was carefully sounded to 10cm. An 8mm suction curette was advanced to the uterine fundus. The suction was them started. The products of conception were evacuated with the curette in a rotation motion on the outward movement. A gentle sharp  curettage was then performed with a large curette with a gritty texture felt in all four quadrants. The suction curette was then reintroduced to clear the uterus. The tenaculum was removed from the cervix and good hemostasis was noted. Monsel's was placed out of precaution.   The patient tolerated the procedure well. She was taken to the recovery room in stable condition.    Sofia Dunn, DO [facility]

## 2023-12-20 NOTE — Anesthesia Procedure Notes (Signed)
 Procedure Name: LMA Insertion Date/Time: 12/20/2023 10:40 AM  Performed by: Niki Barter, CRNAPre-anesthesia Checklist: Patient identified, Emergency Drugs available, Suction available and Patient being monitored Patient Re-evaluated:Patient Re-evaluated prior to induction Oxygen Delivery Method: Circle system utilized Preoxygenation: Pre-oxygenation with 100% oxygen Induction Type: IV induction Ventilation: Mask ventilation without difficulty LMA: LMA inserted LMA Size: 3.0 Placement Confirmation: positive ETCO2 and breath sounds checked- equal and bilateral Tube secured with: Tape Dental Injury: Teeth and Oropharynx as per pre-operative assessment

## 2023-12-20 NOTE — Progress Notes (Addendum)
 Progress Note   Patient: Jenny Cain ZOX:[id number] DOB: 1996-06-19 DOA: 12/18/2023     2 DOS: the patient was seen and examined on 12/20/2023   Brief hospital course: From HPI "Jenny Cain is a 28 y.o. female with past medical history of Medical consult for right flank pain on patient admitted on 4/13 to the OB/GYN service for right flank pain.  Transvaginal ultrasound revealed a nonviable fetus.  CT abdomen and pelvis showed findings compatible with right pyelonephritis/urethritis.  Patient was started on Levaquin for pyelonephritis however both blood and urine cultures returned negative.  Plan is for [facility] on 4/15 and discharged home with Levaquin. Consult was requested to assist in identifying any other potential etiology of right flank pain. Patient reports feeling the pain in the mid right flank and it radiates to the mid lower back.  She denies injury or falls.  Has had back pain before.  She denies history of kidney stones or passing a stone. Chart review: Current vitals unremarkable.  Afebrile though pulse in the 90s Urine culture showed no growth and blood culture no growth to date WBC 12,700 CMP notable for total bilirubin of 1.3 with otherwise normal LFTs and lipase was normal at 25.  "  Assessment and Plan:  Right-sided pyelonephritis Possible renal vs biliary colic Mildly elevated bilirubin CT scan of the abdomen showing findings suggestive of right pyelonephritis Patient also had symptoms of UTI on presentation Urine culture negative given prior antibiotic use Given elevated bilirubin and negative urine cultures right upper quadrant ultrasound have been done to further evaluate gallbladder Continue current antibiotics I agree with completing a total of 10 to 14 days of antibiotic therapy for pyelonephritis at discharge as recommended by urology. Abdominal ultrasound report pending  Hypokalemia-continue repletion and monitoring  Hyponatremia in the setting of  poor oral intake Continue current IV fluid   Missed abortion Patient status post D&C today by obstetrician     [facility] will continue to follow the patient.    Subjective:  Patient seen and examined at bedside this morning Was taken for [facility] by obstetrician today Patient currently having some lower abdominal pain however improving with pain medication Flank pain is much better  Physical Exam: Vitals and nursing note reviewed.  Constitutional:      General: She is not in acute distress. HENT:     Head: Normocephalic and atraumatic.  Cardiovascular:     Rate and Rhythm: Normal rate and regular rhythm.     Heart sounds: Normal heart sounds.  Pulmonary:     Effort: Pulmonary effort is normal.     Breath sounds: Normal breath sounds.  Abdominal:     Palpations: Abdomen is soft.     Tenderness: There is no abdominal tenderness. There is no right CVA tenderness.  Neurological:     Mental Status: Mental status is at baseline.  Vitals:   12/20/23 1200 12/20/23 1210 12/20/23 1215 12/20/23 1230  BP: 98/79  110/77 111/72  Pulse: 68 92 96 (!) 103  Resp: 12 12 14 15   Temp: 97.7 F (36.5 C)  97.7 F (36.5 C) 98 F (36.7 C)  TempSrc:      SpO2: 100% 100% 100% 100%  Weight:      Height:        Data Reviewed: CT scan of the abdomen reviewed    Latest Ref Rng & Units 12/20/2023    2:05 AM 12/18/2023   10:06 AM 10/13/2021   10:06 AM  CBC  WBC 4.0 - 10.5 K/uL 8.8  12.7  6.5   Hemoglobin 12.0 - 15.0 g/dL 78.4  69.6  29.5   Hematocrit 36.0 - 46.0 % 29.1  36.4  38.8   Platelets 150 - 400 K/uL 122  165  241.0        Latest Ref Rng & Units 12/20/2023    2:05 AM 12/18/2023   10:06 AM 01/19/2021    9:53 AM  BMP  Glucose 70 - 99 mg/dL 284  90  93   BUN 6 - 20 mg/dL 5  9  10    Creatinine 0.44 - 1.00 mg/dL 1.32  4.40  1.02   Sodium 135 - 145 mmol/L 130  131  135   Potassium 3.5 - 5.1 mmol/L 3.4  3.6  4.4   [place] 98 - 111 mmol/L 104  100  103   CO2 22 - 32 mmol/L 19  22  23     Calcium 8.9 - 10.3 mg/dL 7.8  9.0  9.0      Family Communication: Discussed with patient's family at bedside   Time spent: 50 minutes  Author: Ezzard Holms, MD 12/20/2023 1:25 PM  For on call review [email or web address].

## 2023-12-20 NOTE — Progress Notes (Signed)
 Patient discharged home with family. Discharge instructions, when to follow up, and prescriptions reviewed with patient. Patient verbalized understanding. Patient will be escorted out by auxiliary.

## 2023-12-20 NOTE — Anesthesia Preprocedure Evaluation (Signed)
 Anesthesia Evaluation  Patient identified by MRN, date of birth, ID band Patient awake    Reviewed: Allergy & Precautions, H&P , NPO status , Patient's Chart, lab work & pertinent test results, reviewed documented beta blocker date and time   History of Anesthesia Complications Negative for: history of anesthetic complications  Airway Mallampati: II  TM Distance: >3 FB Neck ROM: full    Dental  (+) Teeth Intact, Dental Advidsory Given   Pulmonary neg shortness of breath, asthma , neg sleep apnea, neg COPD, neg recent URI, former smoker   Pulmonary exam normal breath sounds clear to auscultation       Cardiovascular Exercise Tolerance: Good (-) hypertension(-) angina (-) Past MI and (-) Cardiac Stents Normal cardiovascular exam+ dysrhythmias (-) Valvular Problems/Murmurs Rhythm:regular Rate:Normal     Neuro/Psych  Headaches, Seizures - (petit mal), Well Controlled,  PSYCHIATRIC DISORDERS Anxiety Depression       GI/Hepatic Neg liver ROS,GERD  ,,  Endo/Other  negative endocrine ROS    Renal/GU negative Renal ROS  negative genitourinary   Musculoskeletal   Abdominal   Peds  Hematology negative hematology ROS (+)   Anesthesia Other Findings Past Medical History: No date: Anxiety No date: Asthma No date: Depression No date: IBS (irritable bowel syndrome) No date: Insomnia No date: Lyme disease No date: Migraine No date: Premature baby No date: Renal disorder     Comment:  age 28, had cyst on kidney No date: Seizures (HCC) 12/17/2023: UTI (urinary tract infection)   Reproductive/Obstetrics (+) Pregnancy 9 weeks                             Anesthesia Physical Anesthesia Plan  ASA: 2  Anesthesia Plan: General   Post-op Pain Management:    Induction: Intravenous  PONV Risk Score and Plan: 3 and Ondansetron, Dexamethasone and Treatment may vary due to age or medical  condition  Airway Management Planned: LMA  Additional Equipment:   Intra-op Plan:   Post-operative Plan: Extubation in OR  Informed Consent: I have reviewed the patients History and Physical, chart, labs and discussed the procedure including the risks, benefits and alternatives for the proposed anesthesia with the patient or authorized representative who has indicated his/her understanding and acceptance.     Dental Advisory Given  Plan Discussed with: Anesthesiologist, CRNA and Surgeon  Anesthesia Plan Comments:        Anesthesia Quick Evaluation

## 2023-12-21 ENCOUNTER — Encounter: Payer: Self-pay | Admitting: Obstetrics

## 2023-12-21 LAB — SURGICAL PATHOLOGY

## 2023-12-23 LAB — CULTURE, BLOOD (ROUTINE X 2)
Culture: NO GROWTH
Culture: NO GROWTH
Special Requests: ADEQUATE

## 2023-12-24 NOTE — Anesthesia Postprocedure Evaluation (Signed)
 Anesthesia Post Note  Patient: Jenny Cain  Procedure(s) Performed: DILATION AND EVACUATION, UTERUS  Patient location during evaluation: PACU Anesthesia Type: General Level of consciousness: awake and alert Pain management: pain level controlled Vital Signs Assessment: post-procedure vital signs reviewed and stable Respiratory status: spontaneous breathing, nonlabored ventilation, respiratory function stable and patient connected to nasal cannula oxygen Cardiovascular status: blood pressure returned to baseline and stable Postop Assessment: no apparent nausea or vomiting Anesthetic complications: no   No notable events documented.   Last Vitals:  Vitals:   12/20/23 1300 12/20/23 1621  BP: 105/75 111/75  Pulse: 95 92  Resp: 16 17  Temp: 36.8 C 36.7 C  SpO2: 99%     Last Pain:  Vitals:   12/20/23 1621  TempSrc: Oral  PainSc:                  [name]

## 2024-01-03 ENCOUNTER — Telehealth: Admitting: Family Medicine

## 2024-01-03 DIAGNOSIS — R102 Pelvic and perineal pain: Secondary | ICD-10-CM

## 2024-01-03 DIAGNOSIS — G479 Sleep disorder, unspecified: Secondary | ICD-10-CM

## 2024-01-03 NOTE — Progress Notes (Signed)
 Given the recent miscarriage and D&C, kideny infection, pelvic pain and bleeding- a lot with not sleeping well or eating well. It is recommended she be seen in person at a local urgent care and or PCP office- given the need for possible labs to make sure she is doing well and that the best sleep medication (if needed- is provided after a work up and eval).  Patient acknowledged agreement and understanding of the plan.

## 2024-08-23 ENCOUNTER — Encounter

## 2024-08-23 ENCOUNTER — Telehealth: Admitting: Physician Assistant

## 2024-08-23 DIAGNOSIS — G43819 Other migraine, intractable, without status migrainosus: Secondary | ICD-10-CM | POA: Diagnosis not present

## 2024-08-23 MED ORDER — PROMETHAZINE HCL 25 MG PO TABS: 25.0000 mg | ORAL_TABLET | Freq: Three times a day (TID) | ORAL | 0 refills | Status: AC | PRN

## 2024-08-23 MED ORDER — PREDNISONE 10 MG (21) PO TBPK: ORAL_TABLET | ORAL | 0 refills | Status: AC
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
## 2024-08-23 NOTE — Patient Instructions (Signed)
 Jenny Cain, thank you for joining [name], PA-C for today's virtual visit.  While this provider is not your primary care provider (PCP), if your PCP is located in our provider database this encounter information will be shared with them immediately following your visit.   A [facility] MyChart account gives you access to today's visit and all your visits, tests, and labs performed at [facility]  click here if you don't have a [facility] MyChart account or go to [email or web address]  Consent: (Patient) Jenny Cain provided verbal consent for this virtual visit at the beginning of the encounter.  Current Medications:  Current Outpatient Medications:    ondansetron  (ZOFRAN ) 4 MG tablet, Take 4 mg by mouth 2 (two) times daily as needed., Disp: , Rfl:    predniSONE  (STERAPRED UNI-PAK 21 TAB) 10 MG (21) TBPK tablet, Take following package directions, Disp: 21 tablet, Rfl: 0   promethazine  (PHENERGAN ) 25 MG tablet, Take 1 tablet (25 mg total) by mouth every 8 (eight) hours as needed for nausea or vomiting., Disp: 20 tablet, Rfl: 0   albuterol  (VENTOLIN  HFA) 108 (90 Base) MCG/ACT inhaler, Inhale 2 puffs into the lungs every 6 (six) hours as needed for wheezing or shortness of breath. (Patient not taking: Reported on 12/18/2023), Disp: 8 g, Rfl: 0   butalbital -acetaminophen -caffeine  (FIORICET) 50-325-40 MG tablet, Take 1 tablet by mouth every 6 (six) hours as needed for headache. Do not exceed 6 per days (Patient not taking: Reported on 04/25/2023), Disp: 14 tablet, Rfl: 0   fluticasone  (FLONASE ) 50 MCG/ACT nasal spray, Place 1 spray into both nostrils daily as needed for allergies or rhinitis. (Patient not taking: Reported on 12/18/2023), Disp: , Rfl:    ibuprofen  (ADVIL ) 600 MG tablet, Take 1 tablet (600 mg total) by mouth every 6 (six) hours as needed for moderate pain (pain score 4-6)., Disp: 30 tablet, Rfl: 0   propranolol (INDERAL) 10 MG tablet, , Disp: ,  Rfl:    PROZAC 20 MG capsule, Take 20 mg by mouth daily., Disp: , Rfl:    Medications ordered in this encounter:  Meds ordered this encounter  Medications   promethazine  (PHENERGAN ) 25 MG tablet    Sig: Take 1 tablet (25 mg total) by mouth every 8 (eight) hours as needed for nausea or vomiting.    Dispense:  20 tablet    Refill:  0    Supervising Provider:   BLAISE ALEENE KIDD [[id number]]   predniSONE  (STERAPRED UNI-PAK 21 TAB) 10 MG (21) TBPK tablet    Sig: Take following package directions    Dispense:  21 tablet    Refill:  0    Supervising Provider:   BLAISE ALEENE KIDD [[id number]]     *If you need refills on other medications prior to your next appointment, please contact your pharmacy*  Follow-Up: Call back or seek an in-person evaluation if the symptoms worsen or if the condition fails to improve as anticipated.  [facility] [phone]  Other Instructions Migraine Headache A migraine headache is a very strong throbbing pain on one or both sides of your head. This type of headache can also cause other symptoms. It can last from 4 hours to 3 days. Talk with your doctor about what things may bring on (trigger) this condition. What are the causes? The exact cause of a migraine is not known. This condition may be brought on or caused by: Smoking. Medicines, such as: Medicine used to treat chest pain (  nitroglycerin). Birth control pills. Estrogen. Some blood pressure medicines. Certain substances in some foods or drinks. Foods and drinks, such as: Cheese. Chocolate. Alcohol. Caffeine . Doing physical activity that is very hard. Other things that may trigger a migraine headache include: Periods. Pregnancy. Hunger. Stress. Getting too much or too little sleep. Weather changes. Feeling tired (fatigue). What increases the risk? Being 70-3 years old. Being female. Having a family history of migraine headaches. Being Caucasian. Having a mental health  condition, such as being sad (depressed) or feeling worried or nervous (anxious). Being very overweight (obese). What are the signs or symptoms? A throbbing pain. This pain may: Happen in any area of the head, such as on one or both sides. Make it hard to do daily activities. Get worse with physical activity. Get worse around bright lights, loud noises, or smells. Other symptoms may include: Feeling like you may vomit (nauseous). Vomiting. Dizziness. Before a migraine headache starts, you may get warning signs (an aura). An aura may include: Seeing flashing lights or having blind spots. Seeing bright spots, halos, or zigzag lines. Having tunnel vision or blurred vision. Having numbness or a tingling feeling. Having trouble talking. Having weak muscles. After a migraine ends, you may have symptoms. These may include: Tiredness. Trouble thinking (concentrating). How is this treated? Taking medicines that: Relieve pain. Relieve the feeling like you may vomit. Prevent migraine headaches. Treatment may also include: Acupuncture. Lifestyle changes like avoiding foods that bring on migraine headaches. Learning ways to control your body functions (biofeedback). Therapy to help you know and deal with negative thoughts (cognitive behavioral therapy). Follow these instructions at home: Medicines Take over-the-counter and prescription medicines only as told by your doctor. If told, take steps to prevent problems with pooping (constipation). You may need to: Drink enough fluid to keep your pee (urine) pale yellow. Take medicines. You will be told what medicines to take. Eat foods that are high in fiber. These include beans, whole grains, and fresh fruits and vegetables. Limit foods that are high in fat and sugar. These include fried or sweet foods. Ask your doctor if you should avoid driving or using machines while you are taking your medicine. Lifestyle  Do not drink alcohol. Do not  smoke or use any products that contain nicotine or tobacco. If you need help quitting, ask your doctor. Get 7-9 hours of sleep each night, or the amount recommended by your doctor. Find ways to deal with stress, such as meditation, deep breathing, or yoga. Try to exercise often. This can help lessen how bad and how often your migraines happen. General instructions Keep a journal to find out what may bring on your migraine headaches. This can help you avoid those things. For example, write down: What you eat and drink. How much sleep you get. Any change to your medicines or diet. If you have a migraine headache: Avoid things that make your symptoms worse, such as bright lights. Lie down in a dark, quiet room. Do not drive or use machinery. Ask your doctor what activities are safe for you. Where to find more information Coalition for Headache and Migraine Patients (CHAMP): headachemigraine.org American Migraine Foundation: americanmigrainefoundation.org National Headache Foundation: headaches.org Contact a doctor if: You get a migraine headache that is different or worse than others you have had. You have more than 15 days of headaches in one month. Get help right away if: Your migraine headache gets very bad. Your migraine headache lasts more than 72 hours. You have a fever  or stiff neck. You have trouble seeing. Your muscles feel weak or like you cannot control them. You lose your balance a lot. You have trouble walking. You faint. You have a seizure. This information is not intended to replace advice given to you by your health care provider. Make sure you discuss any questions you have with your health care provider. Document Revised: 04/19/2022 Document Reviewed: 04/19/2022 Elsevier Patient Education  2024 [employer]   If you have been instructed to have an in-person evaluation today at a local Urgent Care facility, please use the link below. It will take you to a list of  all of our available [facility], including address, phone number and hours of operation. Please do not delay care.  [facility]  If you or a family member do not have a primary care provider, use the link below to schedule a visit and establish care. When you choose a [facility] primary care physician or advanced practice provider, you gain a long-term partner in health. Find a Primary Care Provider  Learn more about [facility]'s in-office and virtual care options:  - Get Care Now
